# Patient Record
Sex: Female | Born: 1980 | Race: Black or African American | Hispanic: No | Marital: Married | Smoking: Never smoker
Health system: Southern US, Community
[De-identification: ages and names within clinical notes are randomized; demographics above are authoritative.]

## PROBLEM LIST (undated history)

## (undated) DIAGNOSIS — G5603 Carpal tunnel syndrome, bilateral upper limbs: Secondary | ICD-10-CM

## (undated) DIAGNOSIS — D649 Anemia, unspecified: Secondary | ICD-10-CM

## (undated) DIAGNOSIS — G43909 Migraine, unspecified, not intractable, without status migrainosus: Secondary | ICD-10-CM

## (undated) HISTORY — PX: TUBAL LIGATION: SHX77

## (undated) HISTORY — PX: CHOLECYSTECTOMY: SHX55

---

## 1999-06-07 ENCOUNTER — Emergency Department (HOSPITAL_COMMUNITY): Admission: EM | Admit: 1999-06-07 | Discharge: 1999-06-08 | Payer: Self-pay | Admitting: Emergency Medicine

## 1999-06-22 ENCOUNTER — Emergency Department (HOSPITAL_COMMUNITY): Admission: EM | Admit: 1999-06-22 | Discharge: 1999-06-22 | Payer: Self-pay | Admitting: Emergency Medicine

## 1999-06-22 ENCOUNTER — Encounter: Payer: Self-pay | Admitting: Emergency Medicine

## 2000-02-19 ENCOUNTER — Emergency Department (HOSPITAL_COMMUNITY): Admission: EM | Admit: 2000-02-19 | Discharge: 2000-02-19 | Payer: Self-pay

## 2002-04-21 ENCOUNTER — Other Ambulatory Visit: Admission: RE | Admit: 2002-04-21 | Discharge: 2002-04-21 | Payer: Self-pay | Admitting: Gynecology

## 2002-09-11 ENCOUNTER — Inpatient Hospital Stay (HOSPITAL_COMMUNITY): Admission: AD | Admit: 2002-09-11 | Discharge: 2002-09-11 | Payer: Self-pay | Admitting: *Deleted

## 2002-12-30 ENCOUNTER — Inpatient Hospital Stay (HOSPITAL_COMMUNITY): Admission: AD | Admit: 2002-12-30 | Discharge: 2002-12-30 | Payer: Self-pay | Admitting: *Deleted

## 2003-01-09 ENCOUNTER — Inpatient Hospital Stay (HOSPITAL_COMMUNITY): Admission: AD | Admit: 2003-01-09 | Discharge: 2003-01-12 | Payer: Self-pay | Admitting: Gynecology

## 2003-01-10 ENCOUNTER — Encounter (INDEPENDENT_AMBULATORY_CARE_PROVIDER_SITE_OTHER): Payer: Self-pay

## 2003-02-20 ENCOUNTER — Other Ambulatory Visit: Admission: RE | Admit: 2003-02-20 | Discharge: 2003-02-20 | Payer: Self-pay | Admitting: Gynecology

## 2004-05-02 ENCOUNTER — Other Ambulatory Visit: Admission: RE | Admit: 2004-05-02 | Discharge: 2004-05-02 | Payer: Self-pay | Admitting: Gynecology

## 2006-02-16 ENCOUNTER — Other Ambulatory Visit: Admission: RE | Admit: 2006-02-16 | Discharge: 2006-02-16 | Payer: Self-pay | Admitting: Gynecology

## 2006-04-29 ENCOUNTER — Inpatient Hospital Stay (HOSPITAL_COMMUNITY): Admission: AD | Admit: 2006-04-29 | Discharge: 2006-04-29 | Payer: Self-pay | Admitting: Gynecology

## 2006-06-08 ENCOUNTER — Encounter: Admission: RE | Admit: 2006-06-08 | Discharge: 2006-06-08 | Payer: Self-pay | Admitting: Women's Health

## 2006-08-31 ENCOUNTER — Ambulatory Visit: Payer: Self-pay | Admitting: Obstetrics & Gynecology

## 2006-08-31 ENCOUNTER — Ambulatory Visit (HOSPITAL_COMMUNITY): Admission: RE | Admit: 2006-08-31 | Discharge: 2006-08-31 | Payer: Self-pay | Admitting: Gynecology

## 2006-09-06 ENCOUNTER — Inpatient Hospital Stay (HOSPITAL_COMMUNITY): Admission: AD | Admit: 2006-09-06 | Discharge: 2006-09-09 | Payer: Self-pay | Admitting: Gynecology

## 2006-09-08 ENCOUNTER — Encounter (INDEPENDENT_AMBULATORY_CARE_PROVIDER_SITE_OTHER): Payer: Self-pay | Admitting: Specialist

## 2006-10-19 ENCOUNTER — Other Ambulatory Visit: Admission: RE | Admit: 2006-10-19 | Discharge: 2006-10-19 | Payer: Self-pay | Admitting: Gynecology

## 2007-07-07 IMAGING — US US OB LIMITED
1 series · 14 of 28 positions shown · non-contrast
Comparison: none

CLINICAL DATA: 20 weeks gestation with abdominal pain.  Known placenta previa.

[Series 1: us ob limited · 0.27mm/px · 14 of 30 slices shown]
[im 2/30]
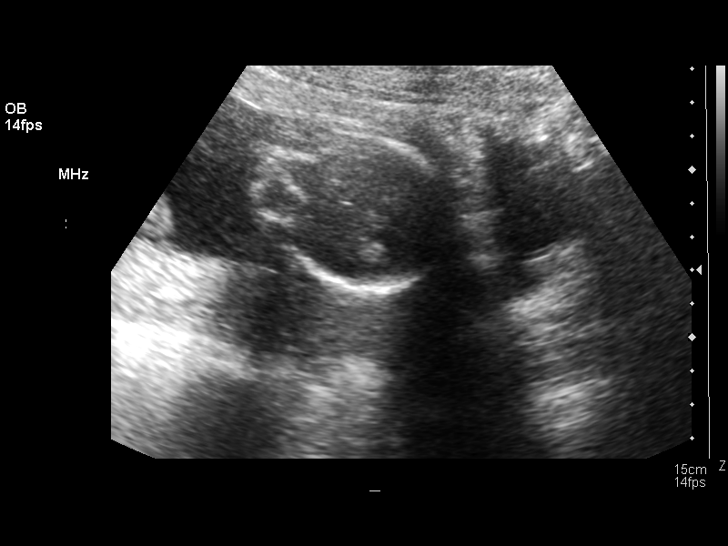
[im 4/30]
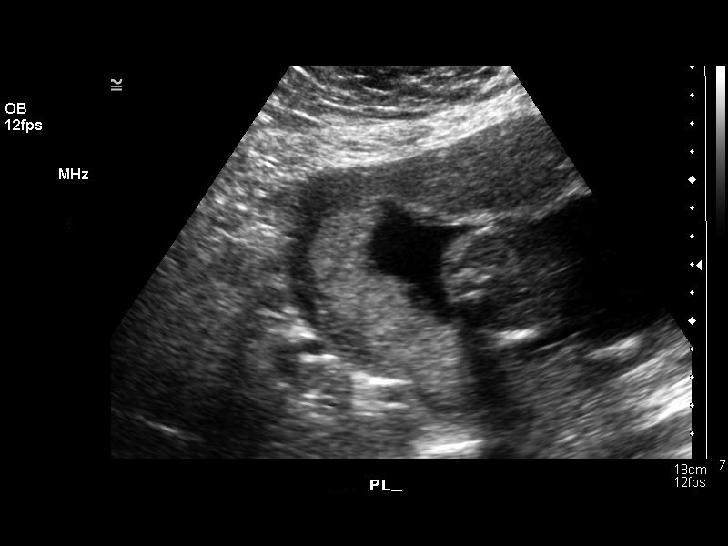
[im 6/30]
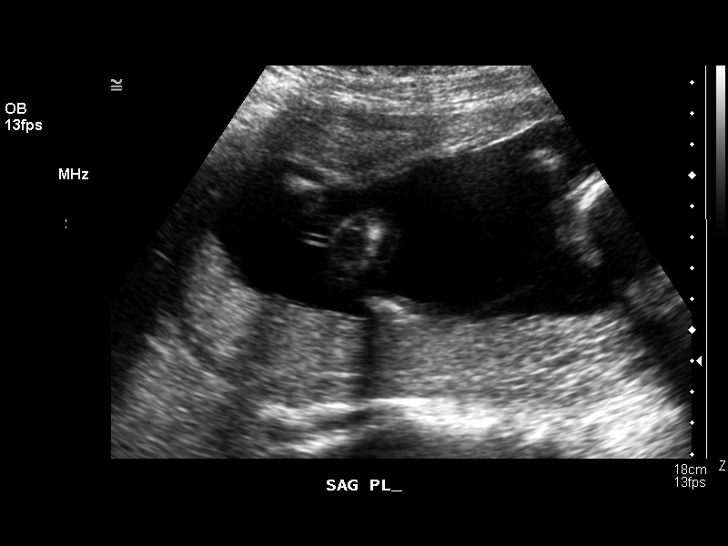
[im 8/30]
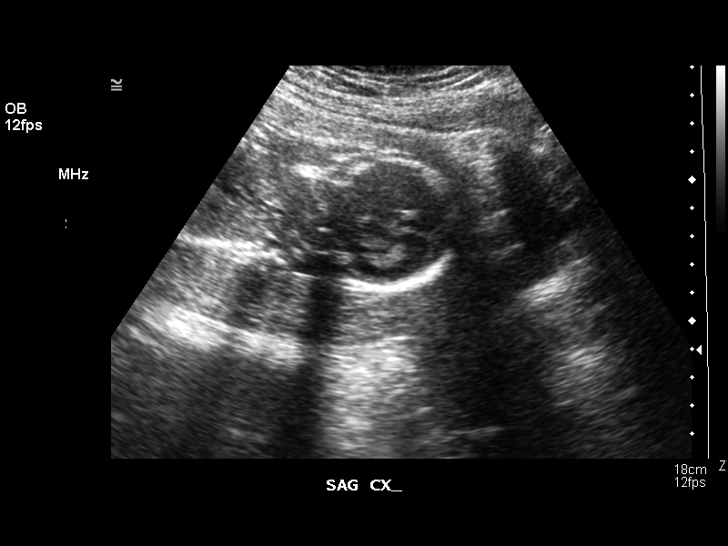
[im 10/30]
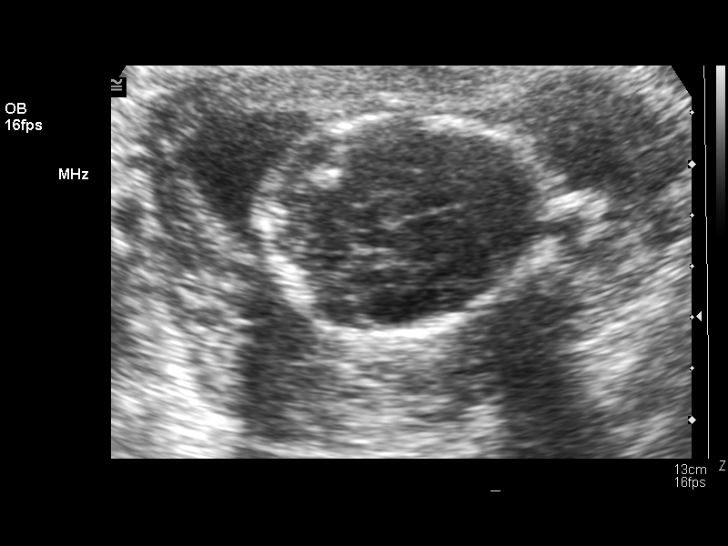
[im 12/30]
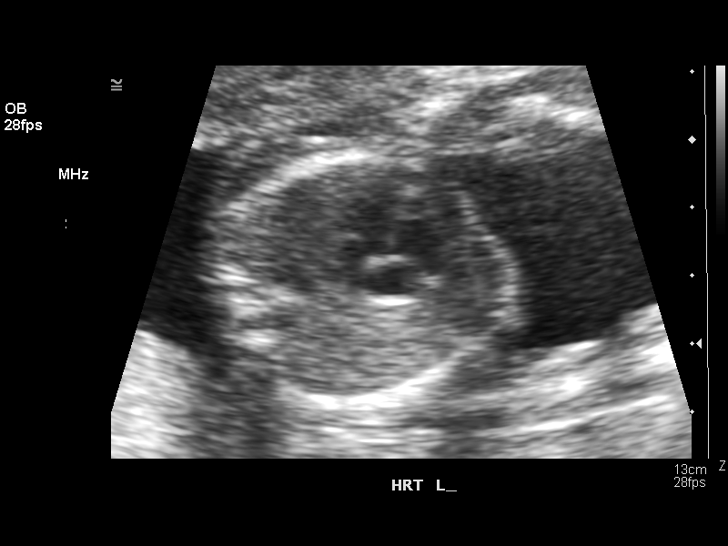
[im 14/30]
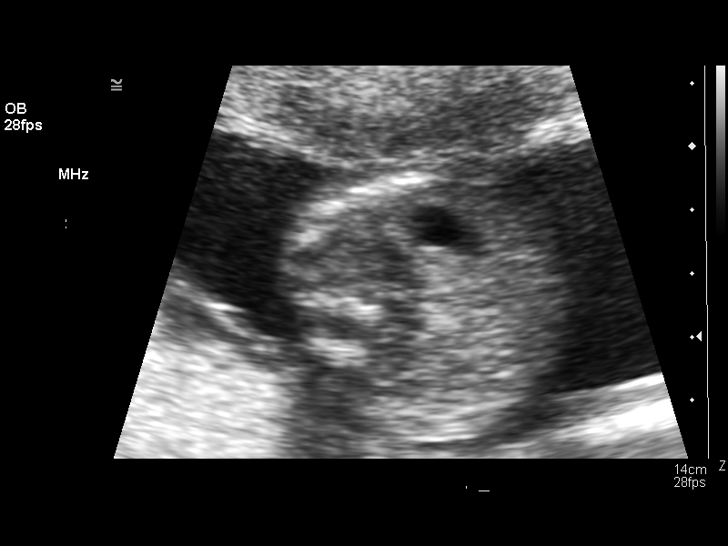
[im 17/30]
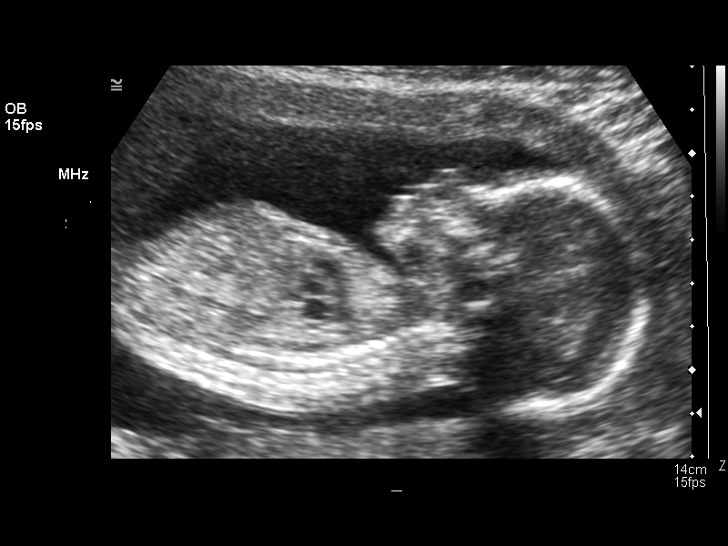
[im 19/30]
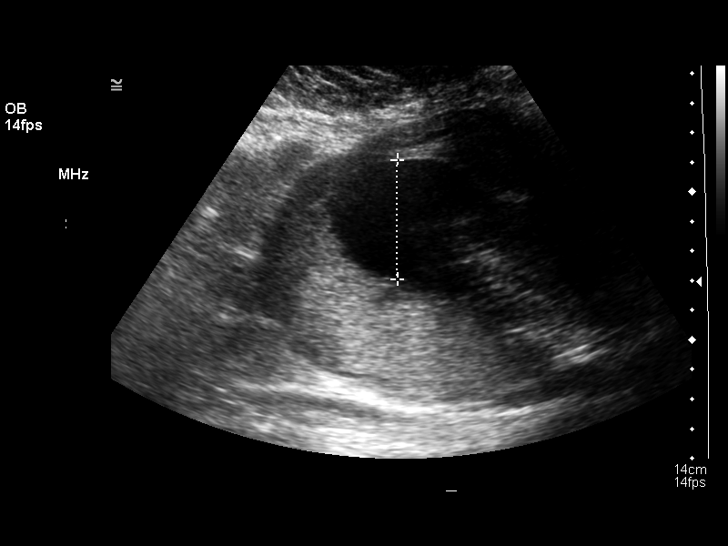
[im 21/30]
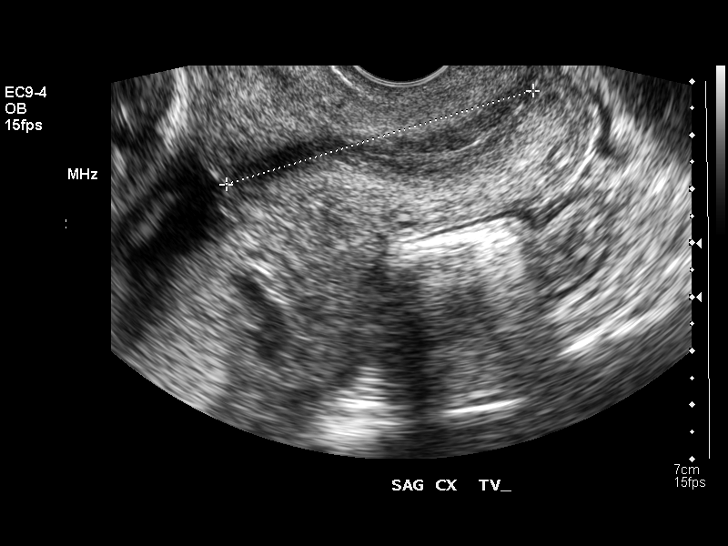
[im 23/30]
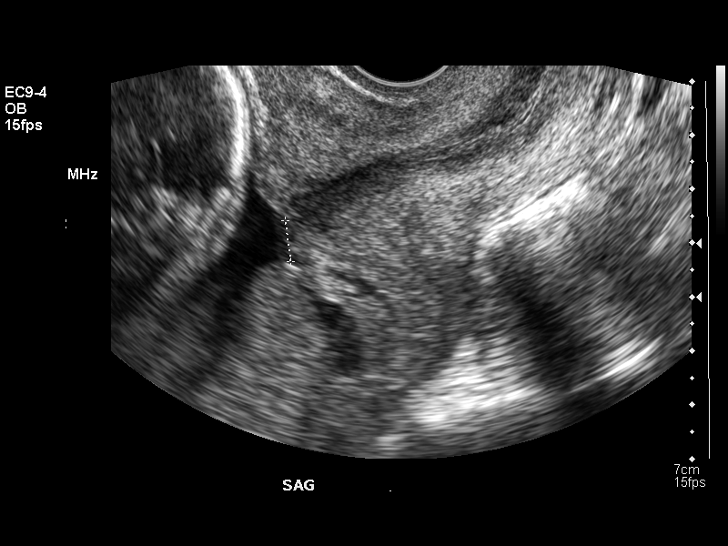
[im 25/30]
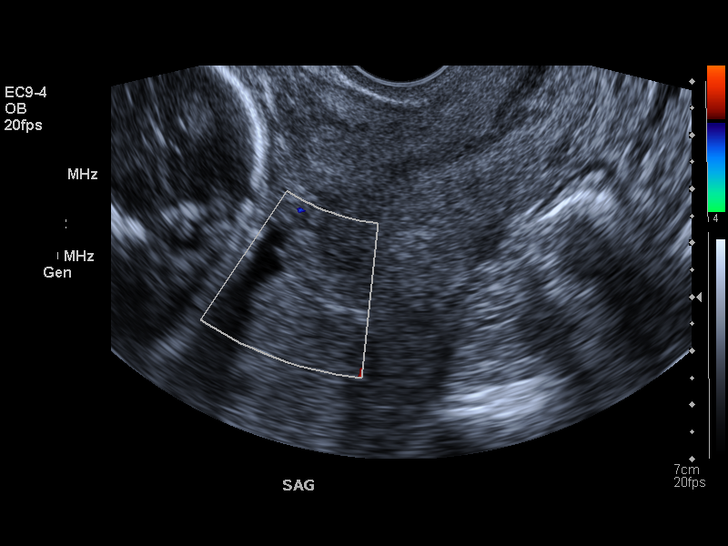
[im 27/30]
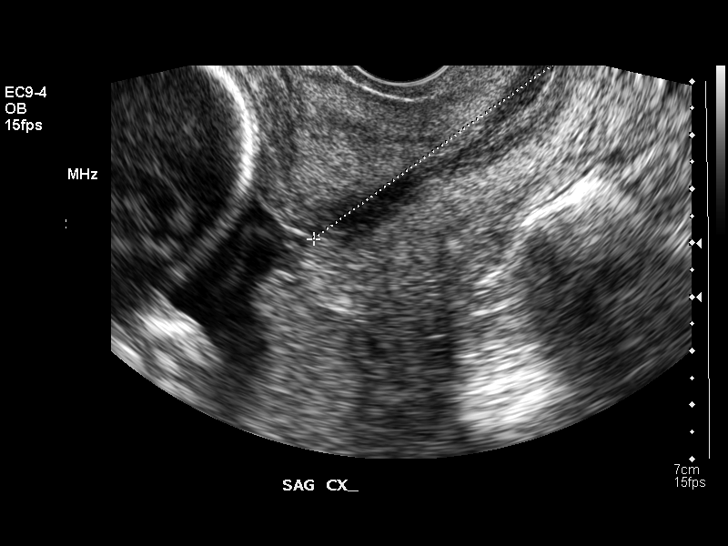
[im 30/30]
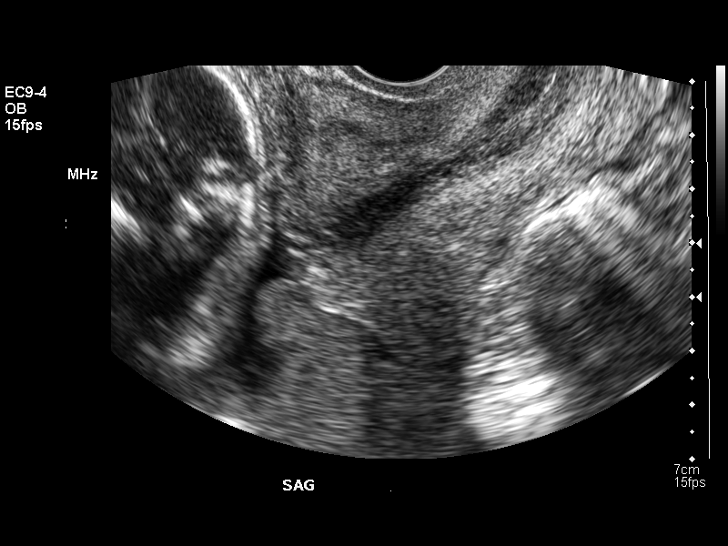

[14 of 28 positions shown; findings below may reference images not displayed]

LIMITED OBSTETRICAL ULTRASOUND AND TRANSVAGINAL OB US:
Number of Fetuses: 1
Heart Rate:  141
Movement:  Yes
Breathing:  No
Presentation:  Cephalic
Placental Location:  Posterior
Grade:  I
Previa:  Marginal; inferior marginal at internal os
Amniotic Fluid (Subjective):  Normal
Amniotic Fluid (Objective):  4.0 cm Vertical pocket 

Fetal measurements and complete anatomic evaluation were not requested.  The following fetal anatomy was visualized during this exam: Lateral ventricles, thalami, stomach, kidneys, and bladder.

MATERNAL UTERINE AND ADNEXAL FINDINGS
Cervix: 5.9 cm transvaginally
IMPRESSION: Single living intrauterine fetus in cephalic presentation with subjectively normal amniotic fluid volume.  Inferior margin of the posterior placenta is essentially at the internal cervical os.

## 2007-11-09 IMAGING — US US OB FOLLOW-UP
1 series · 13 of 28 positions shown · non-contrast
Comparison: none

CLINICAL DATA: Gestational diabetes with known marginal previa.  Assess growth and amniotic fluid volume.

[Series 1: us ob follow-up · 0.35mm/px · 13 of 30 slices shown]
[im 2/30]
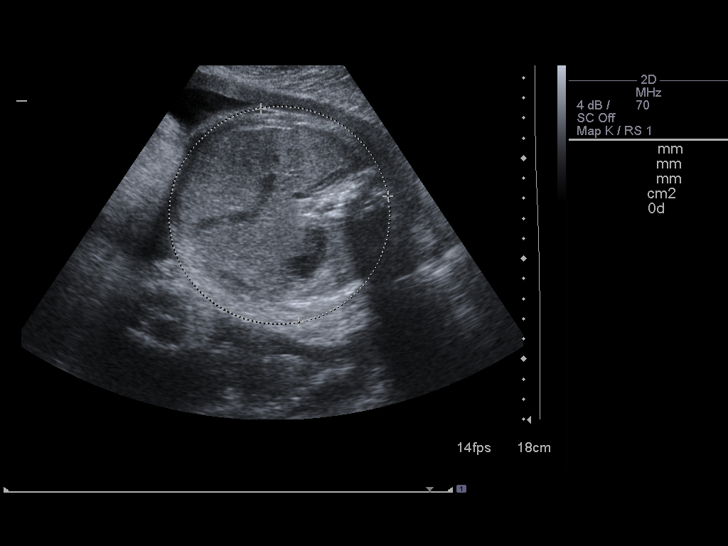
[im 4/30]
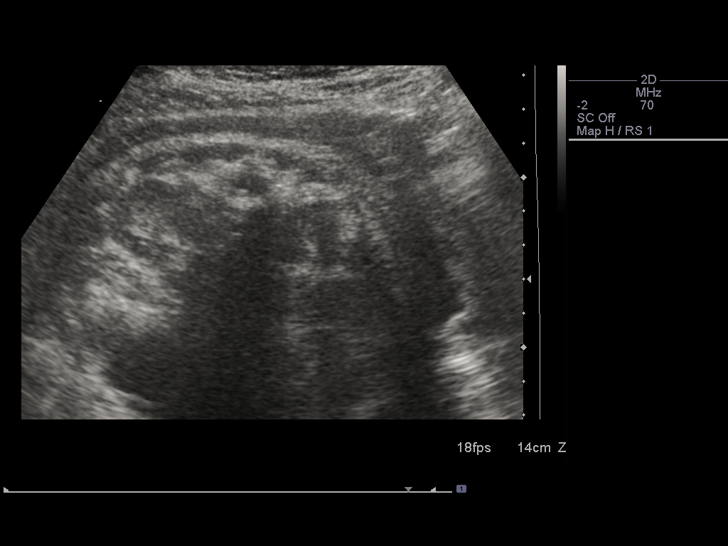
[im 6/30]
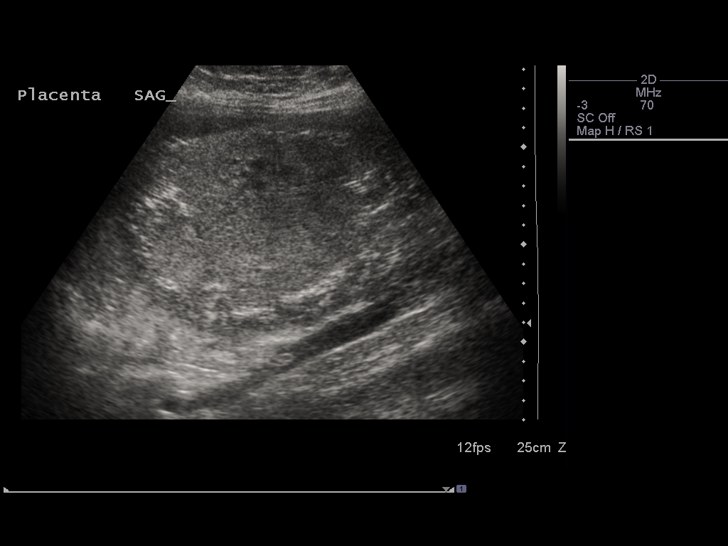
[im 8/30]
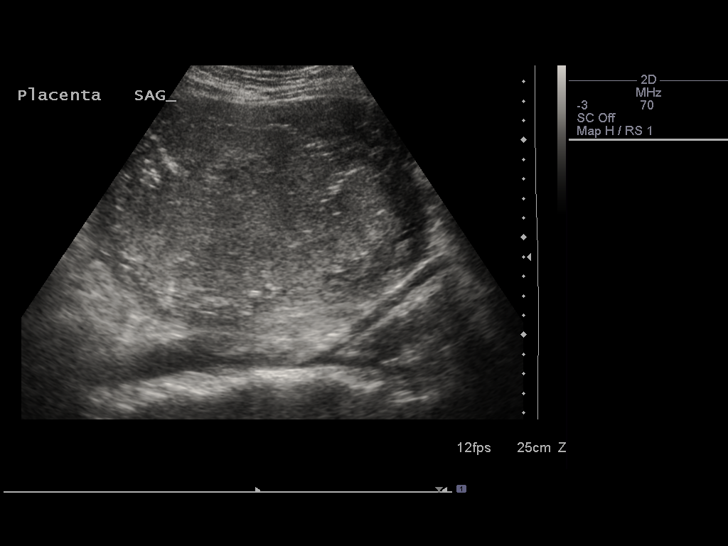
[im 10/30]
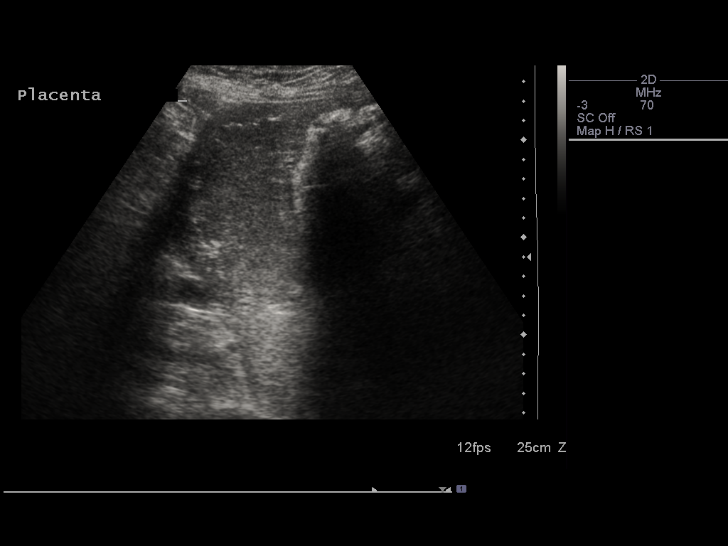
[im 12/30]
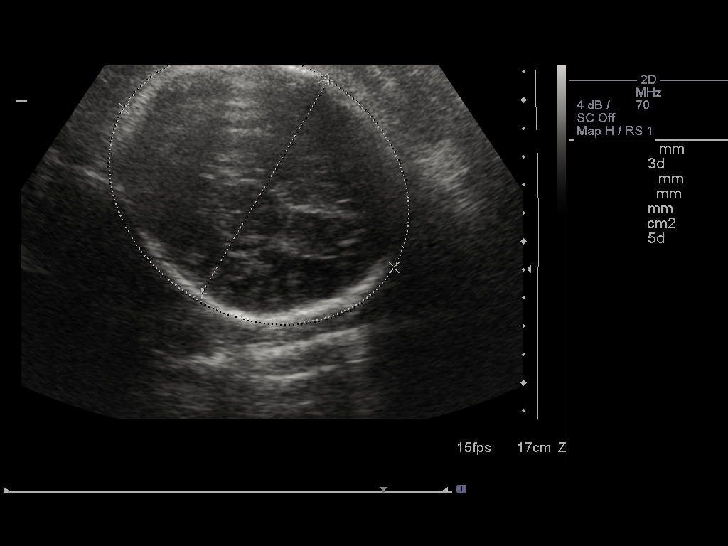
[im 16/30]
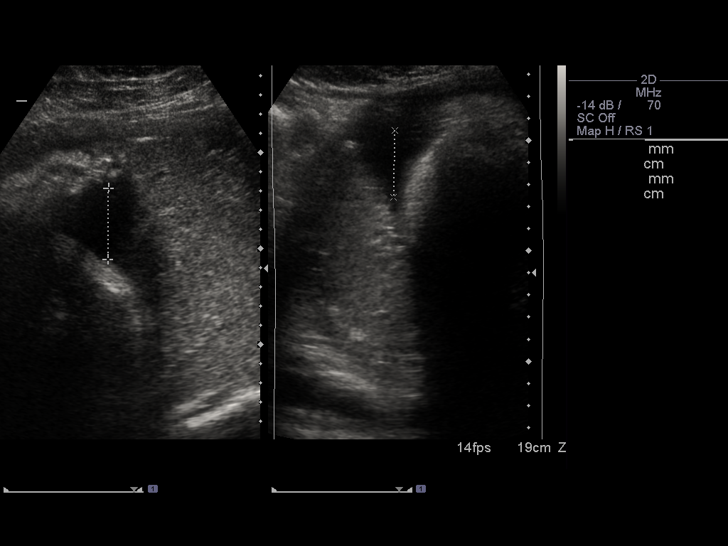
[im 18/30]
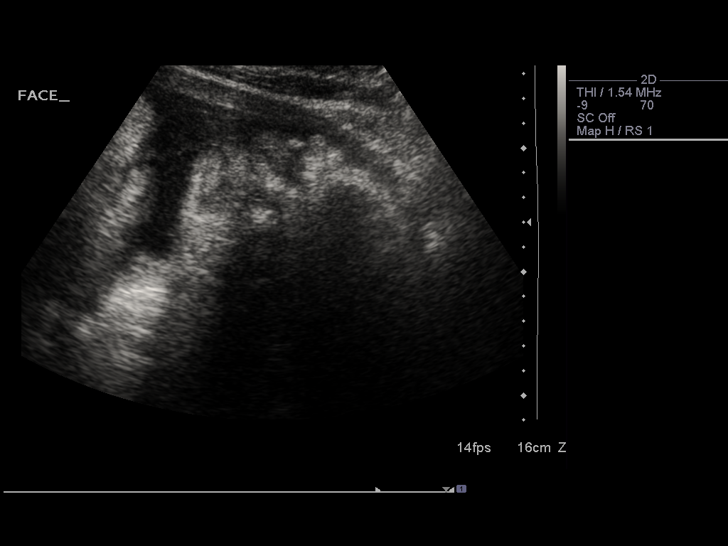
[im 20/30]
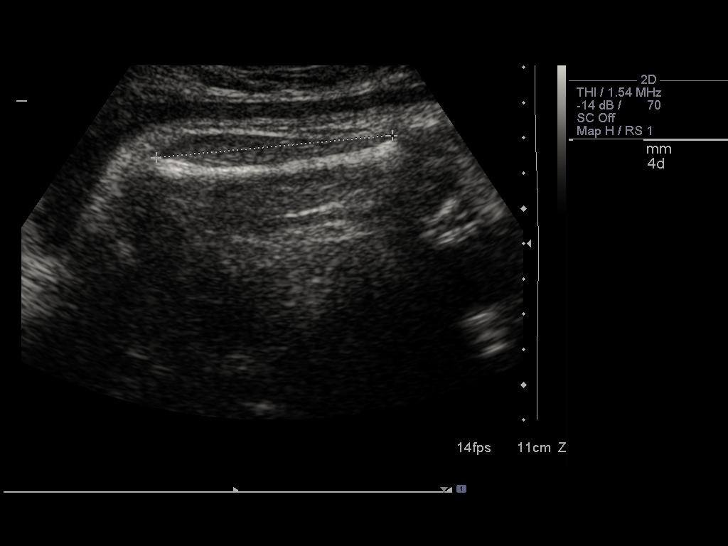
[im 22/30]
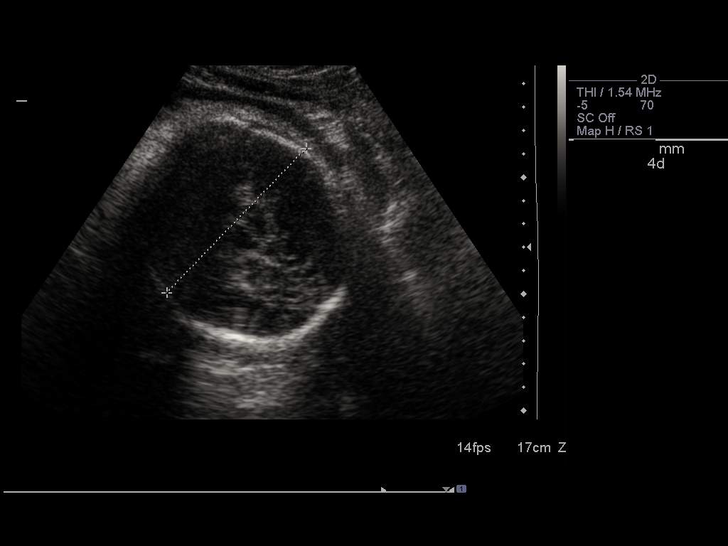
[im 24/30]
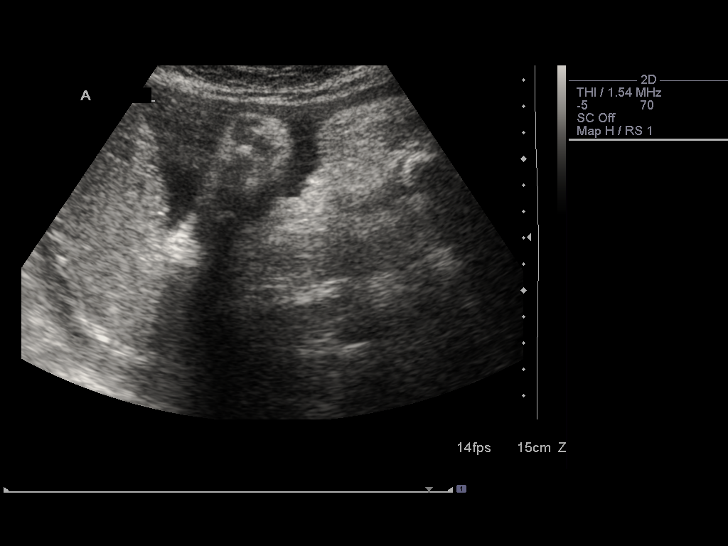
[im 26/30]
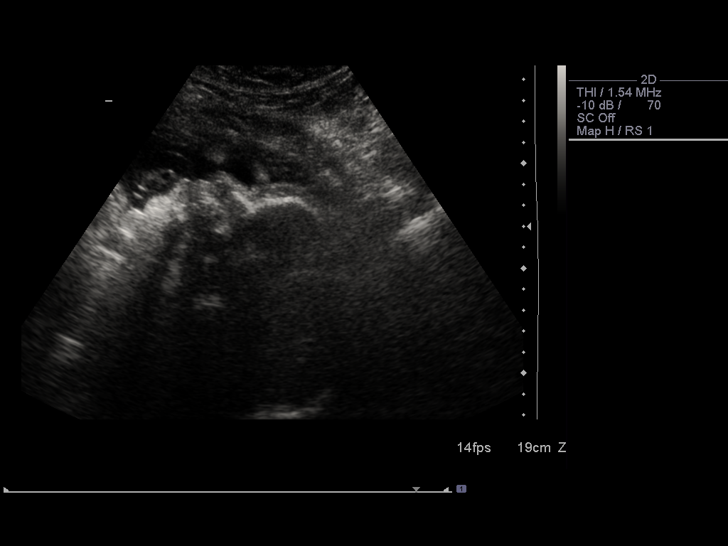
[im 28/30]
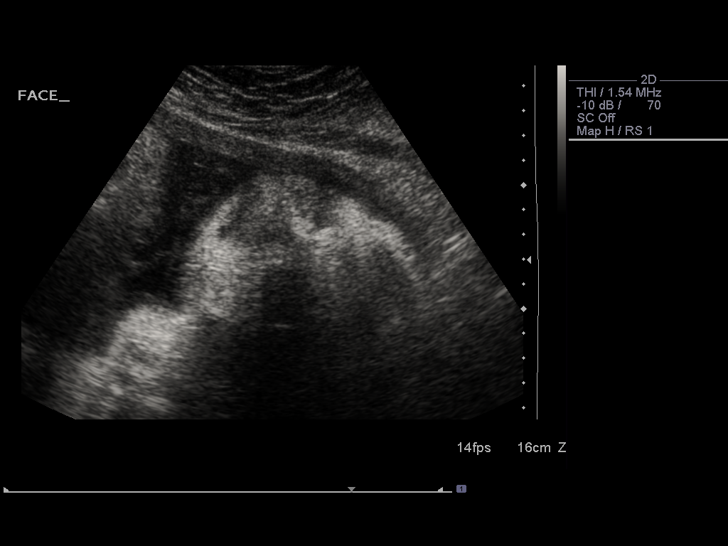

[13 of 28 positions shown; findings below may reference images not displayed]

OBSTETRICAL ULTRASOUND RE-EVALUATION:
 Number of Fetuses:  1
 Heart Rate:  125 bpm
 Movement:  Yes
 Breathing:  No
 Presentation:  Cephalic
 Placental Location:  Posterior
 Grade:  II
 Previa:  Marginal
 Amniotic Fluid (subjective):  Normal
 Amniotic Fluid (objective):  AFI 17.1 cm (7th-07th %ile = 7.3 to 23.9 cm for 38 weeks) 

 FETAL BIOMETRY
 BPD:  8.7 cm   35 w 0 d 
 HC:  31.4 cm   35 w 1 d 
 AC:  34.4 cm   38 w 2 d 
 FL:  6.6 cm   34 w 5 d 
 HL:  6.4 cm   37 w 1 d 
 BPD/OFD  0.82  (0.70 ? 0.86)
 FL/BPD  0.78  (0.71 ? 0.87)
 FL/AC  0.78  (0.20 ? 0.24)
 HC/AC  0.91  (0.92 ? 1.05)

 Mean GA:   35 w 6 d   US EDC:  09/30/06
 Assigned GA:  38 w 3 d   Assigned EDC:  [DATE]

 EFW:  5023 grams 25th ? 50th %ile (0777 ? 9292 g) for 38 weeks 

 FETAL ANATOMY
 Lateral Ventricles:  Previously visualized 
 Thalami/CSP:  Previously visualized 
 Posterior Fossa:  Not visualized 
 Nuchal Region:  Not visualized 
 Spine:  Not visualized 
 4 Chamber Heart on Left:  Not visualized 
 Stomach on Left:  Visualized 
 3 Vessel Cord:  Not visualized 
 Cord Insertion Site:  Not visualized 
 Kidneys:  Visualized 
 Bladder:  Visualized 
 Extremities:  Not visualized 

 ADDITIONAL ANATOMY VISUALIZED:  Diaphragm.  

 MATERNAL UTERINE AND ADNEXAL FINDINGS
 Cervix:  Not evaluated; >34 weeks.
IMPRESSION: 1.  Single intrauterine pregnancy demonstrating an estimated gestational age by ultrasound of 35 weeks 6 days.  Fetal parameters are scattered with head measurements correlating with 35 week 1 day gestation, femur length with a 34 week 5 day gestation, and abdominal circumference and humeral length correlating more with expected assigned gestational age of 38 weeks 3 days.  The femur length is just above the 5th percentile for a 38 week gestation.  Given the maternal and paternal stature this likely represents physiologic limb shortening in light of the more normal humeral length.  Currently the estimated fetal weight is just below the 50th percentile for a 38 week gestation.  The fetal weight may be spuriously underestimated due to the relatively small head size.  
 2.  Subjectively and quantitatively normal amniotic fluid volume.  
 3.  No late developing fetal anatomic abnormalities are seen associated with the stomach, kidneys, or bladder.  The lateral ventricles and 4-chamber heart view could not be seen with clarity due to positioning on today?s exam.

## 2008-10-01 ENCOUNTER — Emergency Department (HOSPITAL_BASED_OUTPATIENT_CLINIC_OR_DEPARTMENT_OTHER): Admission: EM | Admit: 2008-10-01 | Discharge: 2008-10-01 | Payer: Self-pay | Admitting: Emergency Medicine

## 2011-02-28 NOTE — Discharge Summary (Signed)
   NAME:  [name], [name]                     ACCOUNT NO.:  [id number]   MEDICAL RECORD NO.:  [id number]                   PATIENT TYPE:  INP   LOCATION:  9111                                 [facility]ACILITY:  [facility]   PHYSICIAN:  Juan H. Lily Peer, M.D.             DATE OF BIRTH:  [birth year]   DATE OF ADMISSION:  01/09/2003  DATE OF DISCHARGE:  01/12/2003                                 DISCHARGE SUMMARY   DISCHARGE DIAGNOSIS:  1. Intrauterine pregnancy with delivery status post induction for suspect     IUGR.  2. Positive Group-B strep.  3. Status post spontaneous vaginal delivery.   HISTORY:  This 30 year old female gravida 1, para 0 with an EDC of 01/11/03.  Prenatal course uncomplicated.  The patient has a history of asthma, also  positive Group-B strep.  She presents to the office on 01/09/03 complaining  of abdominal pain over the weekend.  Placed on a monitor, had very mild  infrequent contractions, reactive on a stress test.  She was sent for an  ultrasound to rule out possibility of underlying abruption __________ back  discomfort.  An ultrasound demonstrated that the fetus tested severe IUGR  recorded to be the fourth 8th percentile for 40 weeks.  The head  circumference was equivalent to 35 weeks, the abdominal circumference  measured 36 weeks.  The abdominal report stated suboptimal images secondary  to the patient's body habitus.  She weighs 239 pounds.  Estimated fetal  weight is approximately 6.2 pounds.  Patient was admitted for induction for  same.   HOSPITAL COURSE:  On 01/09/03 the patient was admitted and was begun on IV  penicillin G per Group-B strep protocol and Cervidil.  Begun on Pitocin, was  given Cervidil.  The Cervidil was administered on 01/10/03.  Subsequently on  01/10/03 at 1300 the patient underwent a spontaneous vaginal delivery of a  female, Apgars of 8 and 9, weight 6 pounds 6 ounces.  There were no  lacerations.  No episiotomy.  Postpartum patient  afebrile, remained in  stable condition.  She was discharged to home on 01/12/03 and given  [facility] postpartum instructions and postpartum booklet.  Patient is A positive, rubella immune on 01/11/03.  Hemoglobin is 12.1.   DISPOSITION:  1. Patient is discharged to home.  2. Return to the office in six weeks; if she has any problems prior to that     time to be seen in the office.       Susa Loffler, P.A.                    Juan H. Lily Peer, M.D.    TSG/MEDQ  D:  02/04/2003  T:  02/04/2003  Job:  [id number]

## 2011-02-28 NOTE — Op Note (Signed)
NAME:  [name], [name]           ACCOUNT NO.:  [id number]   MEDICAL RECORD NO.:  [id number]          PATIENT TYPE:  INP   LOCATION:  9125                          [facility]ILITY:  [facility]   PHYSICIAN:  Timothy P. Fontaine, M.D.DATE OF BIRTH:  06-21-1981   DATE OF PROCEDURE:  09/08/2006  DATE OF DISCHARGE:                                 OPERATIVE REPORT   PREOPERATIVE DIAGNOSES:  Postpartum, desires permanent sterilization.   POSTOPERATIVE DIAGNOSES:  Postpartum, desires permanent sterilization.   PROCEDURE:  Modified [place]oy bilateral tubal sterilization.   SURGEON:  Dr. Audie Box.   ANESTHETIC:  Epidural.   ESTIMATED BLOOD LOSS:  Minimal.   COMPLICATIONS:  None.   SPECIMEN:  Portions of right and portions of left fallopian tube.   FINDINGS:  Right and left fallopian tubes normal length, caliber, fimbriated  ends.  Right and left ovaries grossly normal to limited inspection.   PROCEDURE:  The patient was taken to the operating room, underwent dosing of  her epidural catheter having been previously placed on labor and delivery.  The patient received a periumbilical preparation with Betadine solution and  draped in usual fashion.  After assuring adequate anesthesia the abdomen  sharply entered through a repeat infraumbilical transverse incision,  excising the old scar upon entry without difficulty.  The left fallopian  tube was then identified, delivered through the incision, traced from its  insertion to its fimbriated end, and mid tubal segment was then doubly  ligated using 0 plain suture in the intervening segment sharply excised.  Tubal lumen as well as adequate hemostasis was grossly visualized.  A  similar procedure was carried out on the other side.  The anterior fascia  was then reapproximated using 0 Vicryl suture in a running stitch.  Subcutaneous tissues showed adequate hemostasis and were reapproximated  using 4-0 Vicryl interrupted stitch.  The skin was reapproximated  using  Dermabond skin adhesive.  The patient was then taken to recovery room in  good condition having tolerated procedure well.      Timothy P. Fontaine, M.D.  Electronically Signed     TPF/MEDQ  D:  09/08/2006  T:  09/08/2006  Job:  [id number]

## 2011-02-28 NOTE — H&P (Signed)
NAME:  [name], [name]                     ACCOUNT NO.:  [id number]   MEDICAL RECORD NO.:  [id number]                   PATIENT TYPE:  INP   LOCATION:  9163                                 [facility]ILITY[facility]   PHYSICIAN:  Juan H. Lily Peer, M.D.             DATE OF BIRTH:  11-13-1980   DATE OF ADMISSION:  01/09/2003  DATE OF DISCHARGE:                                HISTORY & PHYSICAL   CHIEF COMPLAINT:  Low abdominal pain.   HISTORY OF PRESENT ILLNESS:  The patient is a 30 year old, gravida 1, para  0, with a last menstrual period of April 04, 2002, estimated date of  confinement of January 11, 2003, currently 39-6/[redacted] weeks gestation.  She  presented to the office today complaining of low back and lower abdominal  pain over the weekend.  The patient states that she had positive fetal  movement.  She was placed on the monitor.  She was having very mild  infrequent contractions.  Reactive nonstress test in the 150-170 beats per  minute range.  She was sent for an ultrasound to rule out the possibility of  underlying abruption contributing to her back discomfort, although she does  not appear to be in any acute distress.  The ultrasound interestingly  demonstrated the fetus to have severe IUGR, recorded to be in the 4th-8th  percentile for 40-week gestation.  The head circumference was equivalent to  35 weeks as was the abdominal circumference and femur length measurement at  36 weeks.  All of these were off by four weeks, but the ultrasonic report  stated suboptimal images secondary to the patient's body habitus.  She  weighs 239 pounds.  The estimated fetal weight is about 6.2 pounds.  The  patient has gained a total of 19 pounds throughout her pregnancy.  Her  prenatal course in that she has a history of asthma for which she takes  albuterol and Flonase p.r.n.  She has a history of positive group B  Streptococcus.  She had positive strep throat during the pregnancy for which  she was  treated appropriately.  Due to her being [place], she had a  hemoglobin electrophoresis which revealed no evidence of sickle cell trait.  She was treated for a urinary tract infection with Macrobid and up until the  present time when she was noted to have the evidence of IUGR.  On fundal  height measures, she measured 36-37 cm.   PAST MEDICAL HISTORY:  As described above.  History of asthma.  She has a  history of in the past before pregnancy several years ago of GC and  chlamydia.  She denies any other medical problems with the exception of  cholecystectomy in the year 2003.   ALLERGIES:  She denies any allergies.   REVIEW OF SYSTEMS:  See [place] form.   PHYSICAL EXAMINATION:  VITAL SIGNS:  The patient's blood pressure today was  110/68.  Urine  with trace protein and negative glucose.  WEIGHT:  239 pounds.  HEENT:  Unremarkable.  NECK:  Supple.  Trachea midline.  No carotid bruits.  No thyromegaly.  LUNGS:  Clear to auscultation without rhonchi or wheezes.  HEART:  Regular rate and rhythm.  No murmurs or gallops.  BREASTS:  Exam not done.  ABDOMEN:  Gravid uterus.  Vertex presentation by [facility] maneuver.  Fundal  height 36-37 cm.  PELVIC:  Cervix long, closed, and posterior.  EXTREMITIES:  DTRs 1+.  Negative clonus.  Trace edema.   PRENATAL LABORATORY DATA:  B+ blood type.  Negative antibiotic screen.  VDRL  was nonreactive.  Rubella immune.  Hepatitis B surface antigen and HIV were  negative.  Alpha-fetoprotein was normal.  GBS culture was positive.  Diabetes screen was normal.   ASSESSMENT:  A 30 year old, gravida 1, para 0, at 39-6/[redacted] weeks gestation  with evidence of severe intrauterine growth restriction in the 4th-8th  percentile on the growth curve.  Estimated fetal weight 2780-2833 g.  AFI  11.1 cm, in the 40th percentile.  Reactive fetal heart rate tracing.  Patient with positive GBS culture.  Of note, she had a urinalysis done today  due to the fact that  she was having some pelvic pressure and the urinalysis  also demonstrated evidence of possible early urinary tract infection with 2+  bacteria and 6-8 wbc's.   PLAN:  Will admit her and start IV fluids of lactated Ringer's.  Will place  Cervidil for cervical ripening.  Will start on Pen-G 5 million units IV as a  loading dose followed by 2.5 million units IV q.4h.  After 12 hours of the  Cervidil, will start the Pitocin providing the fetal heart rate tracing  continues to be reassuring.  All of this and above were discussed with the  patient and concerns were raised.  All questions were answered.  Will follow  accordingly.                                               Juan H. Lily Peer, M.D.    JHF/MEDQ  D:  01/09/2003  T:  01/09/2003  Job:  [id number]

## 2011-07-18 LAB — RAPID STREP SCREEN (MED CTR MEBANE ONLY): Streptococcus, Group A Screen (Direct): POSITIVE — AB

## 2012-06-20 ENCOUNTER — Emergency Department (HOSPITAL_COMMUNITY)
Admission: EM | Admit: 2012-06-20 | Discharge: 2012-06-20 | Disposition: A | Discharge status: Home / Self Care | Payer: Medicare HMO | Source: Home / Self Care

## 2012-10-13 HISTORY — PX: OTHER SURGICAL HISTORY: SHX169

## 2018-08-14 ENCOUNTER — Emergency Department (HOSPITAL_BASED_OUTPATIENT_CLINIC_OR_DEPARTMENT_OTHER): Payer: Medicare HMO

## 2018-08-14 ENCOUNTER — Other Ambulatory Visit: Payer: Self-pay

## 2018-08-14 ENCOUNTER — Emergency Department (HOSPITAL_BASED_OUTPATIENT_CLINIC_OR_DEPARTMENT_OTHER)
Admission: EM | Admit: 2018-08-14 | Discharge: 2018-08-14 | Disposition: A | Discharge status: Home / Self Care | Payer: Medicare HMO | Attending: Emergency Medicine | Admitting: Emergency Medicine

## 2018-08-14 ENCOUNTER — Encounter (HOSPITAL_BASED_OUTPATIENT_CLINIC_OR_DEPARTMENT_OTHER): Payer: Self-pay | Admitting: Emergency Medicine

## 2018-08-14 DIAGNOSIS — M940 Chondrocostal junction syndrome [Tietze]: Secondary | ICD-10-CM | POA: Insufficient documentation

## 2018-08-14 DIAGNOSIS — J069 Acute upper respiratory infection, unspecified: Secondary | ICD-10-CM | POA: Insufficient documentation

## 2018-08-14 DIAGNOSIS — B9789 Other viral agents as the cause of diseases classified elsewhere: Secondary | ICD-10-CM

## 2018-08-14 HISTORY — DX: Migraine, unspecified, not intractable, without status migrainosus: G43.909

## 2018-08-14 MED ORDER — BENZONATATE 100 MG PO CAPS: 100.0000 mg | ORAL_CAPSULE | Freq: Three times a day (TID) | ORAL | 0 refills | Status: DC

## 2018-08-14 MED ORDER — PREDNISONE 20 MG PO TABS: 40.0000 mg | ORAL_TABLET | Freq: Every day | ORAL | 0 refills | Status: AC

## 2018-08-14 NOTE — ED Provider Notes (Signed)
[facility] EMERGENCY DEPARTMENT Provider Note   CSN: [id number] Arrival date & time: 08/14/18  1054     History   Chief Complaint Chief Complaint  Patient presents with  . Cough    HPI [name] is a 37 y.o. female resents for evaluation of cough and nasal congestion that has been ongoing for last 5 days as well as chest soreness that developed this morning.  Patient states that for the last several days, she has been sick with nasal congestion, rhinorrhea, cough.  She states that she took over-the-counter Robitussin, other cough medications which slightly improved her symptoms.  She states cough worsened last night.  Cough is productive of yellow phlegm.  No hemoptysis.  She states that this morning, she started developing some chest soreness.  She states that the chest soreness is intermittent and is worse with palpation, coughing, and deep inspriation.  No chest pain with exertion.  She did not get nauseous or diaphoretic with onset of this pain.  Patient has not taken any medications for the pain.  Patient states that she initially also had a sore throat but that has improved.  Patient denies any fevers, difficulty breathing, abdominal pain, vomiting.  The history is provided by the patient.    Past Medical History:  Diagnosis Date  . Migraines     There are no active problems to display for this patient.   Past Surgical History:  Procedure Laterality Date  . TUBAL LIGATION       OB History   None      Home Medications    Prior to Admission medications   Medication Sig Start Date End Date Taking? Authorizing Provider  benzonatate (TESSALON) 100 MG capsule Take 1 capsule (100 mg total) by mouth every 8 (eight) hours. 08/14/18   Volanda Napoleon, PA-C  predniSONE (DELTASONE) 20 MG tablet Take 2 tablets (40 mg total) by mouth daily for 4 days. 08/14/18 08/18/18  Volanda Napoleon, PA-C    Family History No family history on file.  Social  History Social History   Tobacco Use  . Smoking status: Never Smoker  . Smokeless tobacco: Never Used  Substance Use Topics  . Alcohol use: Not Currently  . Drug use: Never     Allergies   Patient has no known allergies.   Review of Systems Review of Systems  Constitutional: Negative for fever.  HENT: Positive for congestion.   Respiratory: Positive for cough. Negative for shortness of breath.   Cardiovascular: Positive for chest pain (soreness).  Gastrointestinal: Negative for abdominal pain, nausea and vomiting.  Genitourinary: Negative for dysuria and hematuria.  Neurological: Negative for headaches.  All other systems reviewed and are negative.    Physical Exam Updated Vital Signs BP (!) 145/99 (BP Location: Left Arm)   Pulse 84   Temp 98.5 F (36.9 C) (Oral)   Resp 18   Ht 5\' 4"  (1.626 m)   Wt 108.9 kg   LMP 07/27/2018   SpO2 99%   BMI 41.20 kg/m   Physical Exam  Constitutional: She is oriented to person, place, and time. She appears well-developed and well-nourished.  HENT:  Head: Normocephalic and atraumatic.  Nose: Mucosal edema present.  Mouth/Throat: Oropharynx is clear and moist and mucous membranes are normal.  Edematous and erythematous bilateral nasal turbinates.  Airways patent, phonation is intact.  Eyes: Pupils are equal, round, and reactive to light. Conjunctivae, EOM and lids are normal.  Neck: Full passive range of motion  without pain.  Cardiovascular: Normal rate, regular rhythm, normal heart sounds and normal pulses. Exam reveals no gallop and no friction rub.  No murmur heard. Pulses:      Radial pulses are 2+ on the right side, and 2+ on the left side.  Pulmonary/Chest: Effort normal and breath sounds normal.  Lungs clear to auscultation bilaterally.  Symmetric chest rise.  No wheezing, rales, rhonchi.  Pain reproduced with palpation and movement of the upper extremities.  Abdominal: Soft. Normal appearance. There is no tenderness.  There is no rigidity and no guarding.  Musculoskeletal: Normal range of motion.  Neurological: She is alert and oriented to person, place, and time.  Skin: Skin is warm and dry. Capillary refill takes less than 2 seconds.  Psychiatric: She has a normal mood and affect. Her speech is normal.  Nursing note and vitals reviewed.    ED Treatments / Results  Labs (all labs ordered are listed, but only abnormal results are displayed) Labs Reviewed - No data to display  EKG None  Radiology Dg Chest 2 View  Result Date: 08/14/2018 CLINICAL DATA:  Cough and shortness of breath for the past week. EXAM: CHEST - 2 VIEW COMPARISON:  None. FINDINGS: The heart size and mediastinal contours are within normal limits. Both lungs are clear. The visualized skeletal structures are unremarkable. IMPRESSION: No active cardiopulmonary disease. Electronically Signed   By: Titus Dubin M.D.   On: 08/14/2018 11:50    Procedures Procedures (including critical care time)  Medications Ordered in ED Medications - No data to display   Initial Impression / Assessment and Plan / ED Course  I have reviewed the triage vital signs and the nursing notes.  Pertinent labs & imaging results that were available during my care of the patient were reviewed by me and considered in my medical decision making (see chart for details).     37 year old female who presents for evaluation of cough, nasal congestion times several days as well as chest soreness that began this morning.  Reports cough worsened last night.  The soreness is intermittent and is worse with coughing, palpation and is worse with deep inspiration.  No exertional chest pain.  No associated diaphoresis, nausea. Patient is afebrile, non-toxic appearing, sitting comfortably on examination table. Vital signs reviewed and stable.  Pain reproduced by palpation on exam.  Consider upper respiratory infection with associated costochondritis.  History/physical exam  is not concerning for ACS etiology.  Additionally, not suspect PE given history/physical exam. Plan for CXR for eval pneumonia.   Chest x-ray reviewed.  Negative for any acute infectious etiology.   Discussed results with patient. At this time, patient exhibits no emergent life-threatening condition that require further evaluation in ED.  Will plan for short course of prednisone to help with cough.  Encourage at home supportive care measures. Patient had ample opportunity for questions and discussion. All patient's questions were answered with full understanding. Strict return precautions discussed. Patient expresses understanding and agreement to plan.   Final Clinical Impressions(s) / ED Diagnoses   Final diagnoses:  Viral URI with cough  Costochondritis    ED Discharge Orders         Ordered    benzonatate (TESSALON) 100 MG capsule  Every 8 hours     08/14/18 1201    predniSONE (DELTASONE) 20 MG tablet  Daily     08/14/18 1201           Volanda Napoleon, PA-C 08/14/18 2000  Lajean Saver, MD 08/15/18 [phone]

## 2018-08-14 NOTE — ED Triage Notes (Signed)
Cough and congestion x 4 days. 

## 2018-08-14 NOTE — Discharge Instructions (Addendum)
You can take Tylenol or Ibuprofen as directed for pain. You can alternate Tylenol and Ibuprofen every 4 hours. If you take Tylenol at 1pm, then you can take Ibuprofen at 5pm. Then you can take Tylenol again at 9pm.   Take Prednisone as directed.   Take Tessalon perles to help with cough.   Follow-up with [facility] for further evaluation.   Return to the Emergency Department for any difficulty breathing, abdominal pain, chest pain, fever, persistent vomiting.

## 2019-10-22 IMAGING — DX DG CHEST 2V
2 series · 2 of 2 positions shown · non-contrast
Comparison: None.

CLINICAL DATA: Cough and shortness of breath for the past week.

EXAM:
CHEST - 2 VIEW

[chest pa]
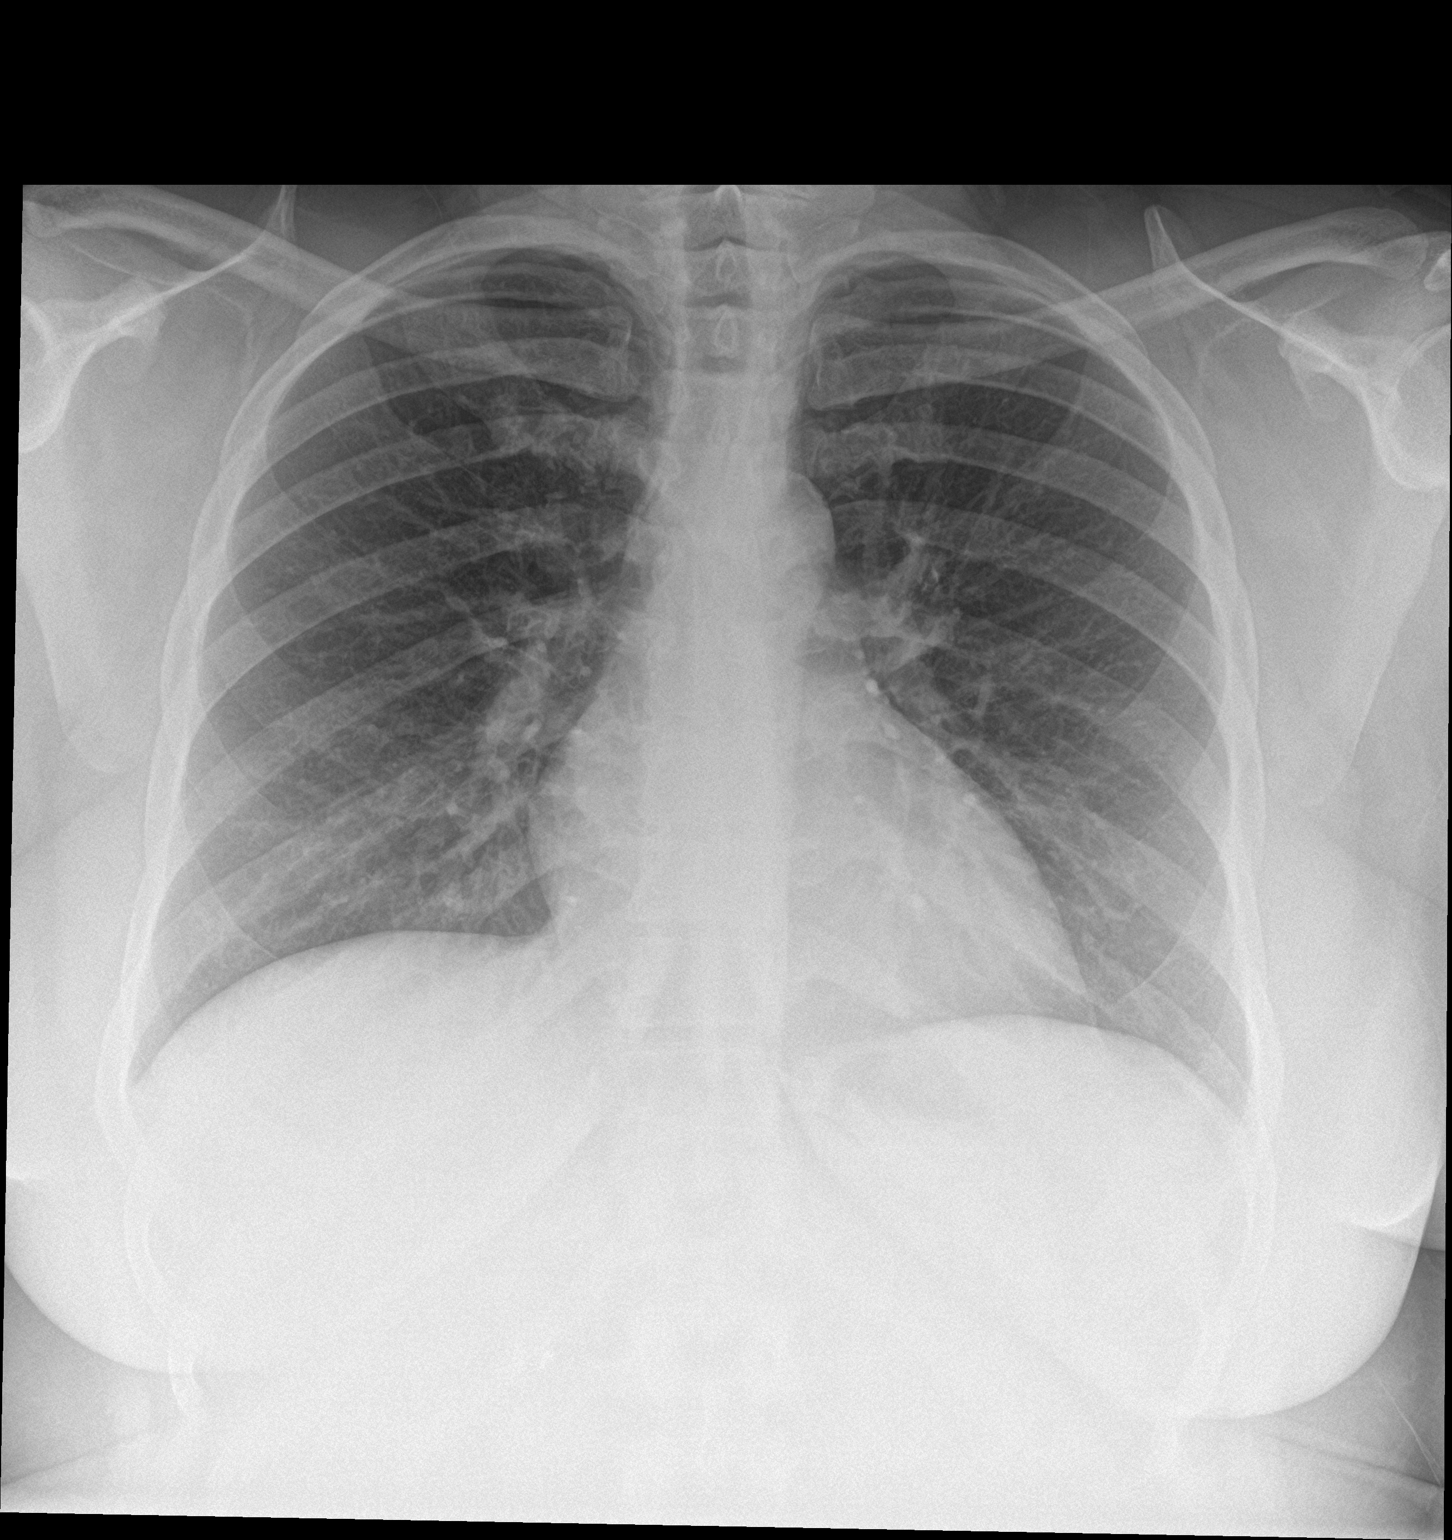

[chest lat]
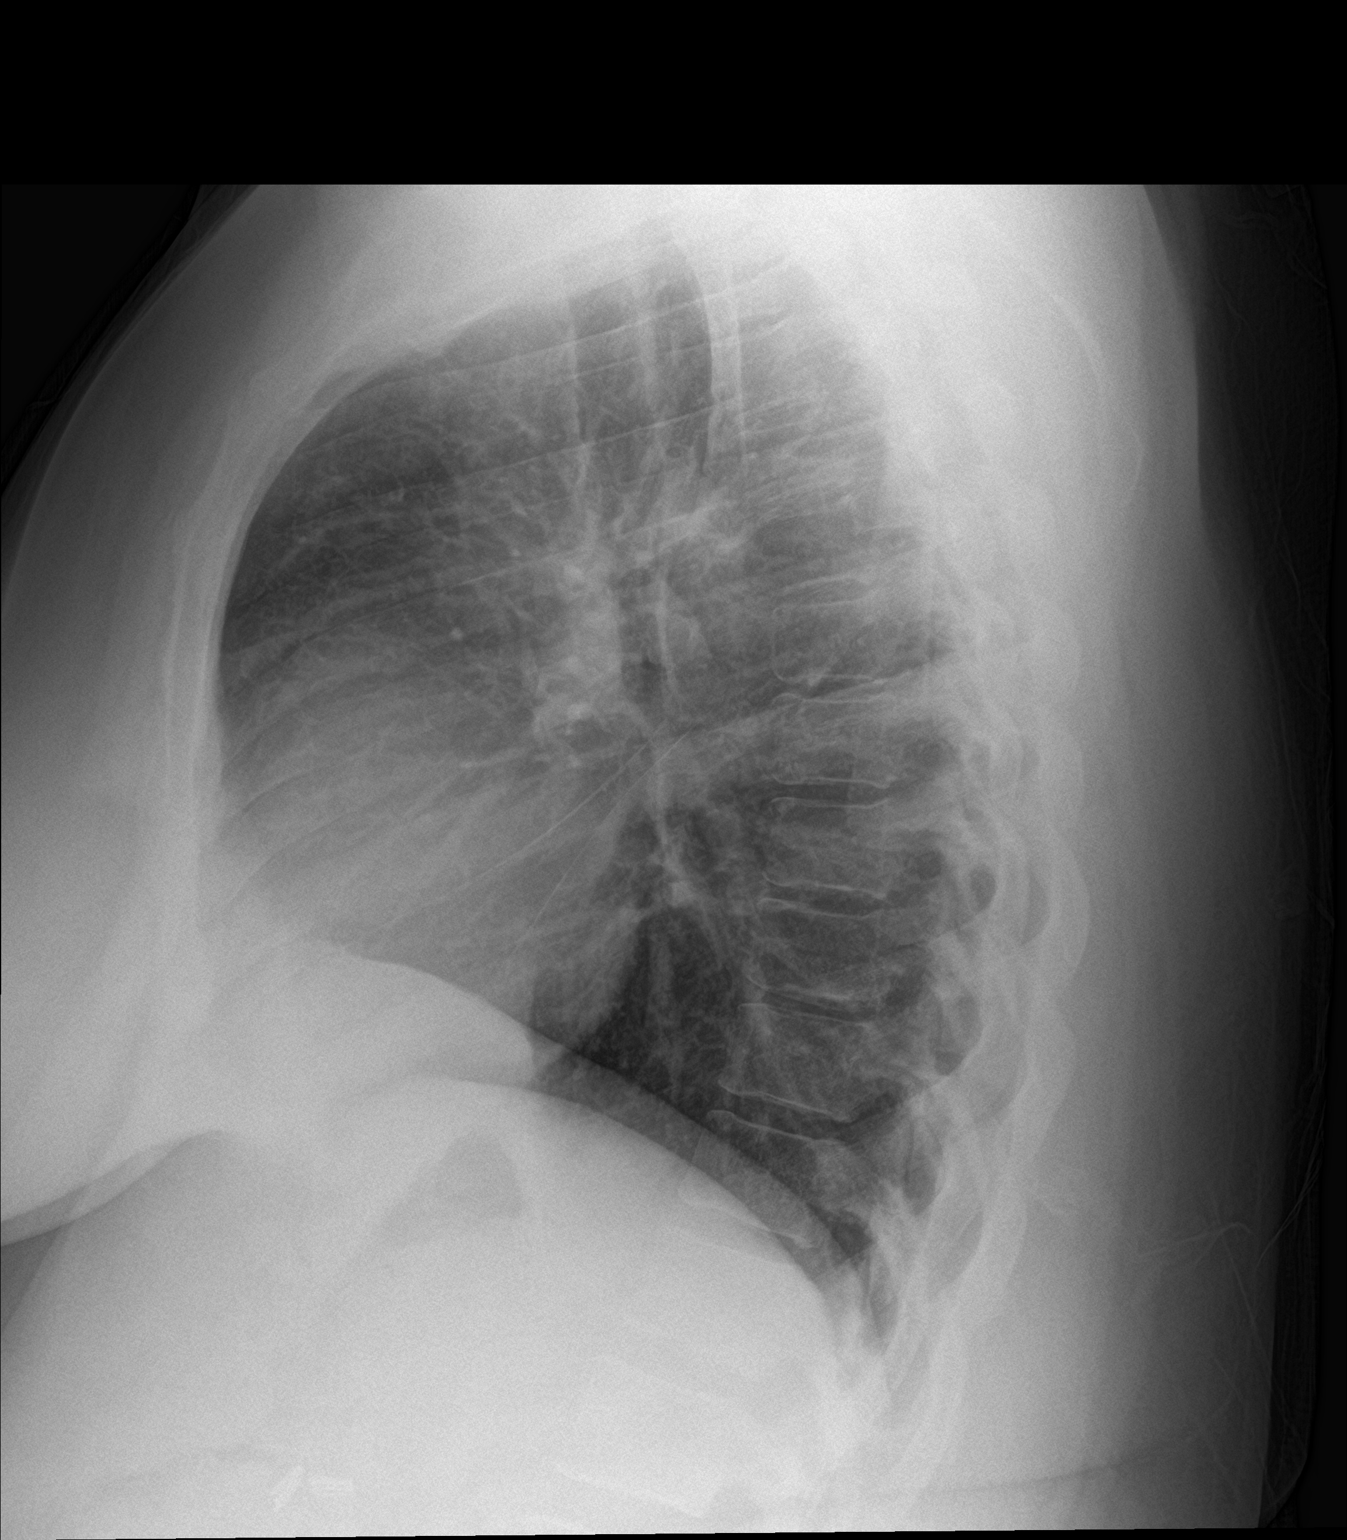

[2 of 2 positions shown; findings below may reference images not displayed]

FINDINGS: The heart size and mediastinal contours are within normal limits.
Both lungs are clear. The visualized skeletal structures are
unremarkable.
IMPRESSION: No active cardiopulmonary disease.

## 2020-01-13 ENCOUNTER — Ambulatory Visit: Payer: Self-pay | Attending: Internal Medicine

## 2020-01-13 DIAGNOSIS — Z23 Encounter for immunization: Secondary | ICD-10-CM

## 2020-01-13 NOTE — Progress Notes (Signed)
   Covid-19 Vaccination Clinic  Name:  [name]    MRN: [id number] DOB: 06-Jan-1981  01/13/2020  [name] was observed post Covid-19 immunization for 15 minutes without incident. She was provided with Vaccine Information Sheet and instruction to access the V-Safe system.   [name] was instructed to call 911 with any severe reactions post vaccine: Marland Kitchen Difficulty breathing  . Swelling of face and throat  . A fast heartbeat  . A bad rash all over body  . Dizziness and weakness   Immunizations Administered    Name Date Dose VIS Date Route   Pfizer COVID-19 Vaccine 01/13/2020 11:50 AM 0.3 mL 09/23/2019 Intramuscular   Manufacturer: [employer], [employer]   Lot: [id number]   [place]: [id number]

## 2020-02-06 ENCOUNTER — Ambulatory Visit: Payer: Self-pay | Attending: Internal Medicine

## 2020-02-06 DIAGNOSIS — Z23 Encounter for immunization: Secondary | ICD-10-CM

## 2020-02-06 NOTE — Progress Notes (Signed)
   Covid-19 Vaccination Clinic  Name:  [name]    MRN: [id number] DOB: November 30, 1980  02/06/2020  [name] was observed post Covid-19 immunization for 15 minutes without incident. She was provided with Vaccine Information Sheet and instruction to access the V-Safe system.   [name] was instructed to call 911 with any severe reactions post vaccine: Marland Kitchen Difficulty breathing  . Swelling of face and throat  . A fast heartbeat  . A bad rash all over body  . Dizziness and weakness   Immunizations Administered    Name Date Dose VIS Date Route   Pfizer COVID-19 Vaccine 02/06/2020 11:49 AM 0.3 mL 12/07/2018 Intramuscular   Manufacturer: [place]   Lot: [id number]   [place]: [id number]

## 2021-02-08 ENCOUNTER — Other Ambulatory Visit: Payer: Self-pay | Admitting: Obstetrics and Gynecology

## 2021-02-08 DIAGNOSIS — N920 Excessive and frequent menstruation with regular cycle: Secondary | ICD-10-CM

## 2021-02-12 ENCOUNTER — Other Ambulatory Visit: Payer: Self-pay

## 2021-02-14 ENCOUNTER — Ambulatory Visit
Admission: RE | Admit: 2021-02-14 | Discharge: 2021-02-14 | Disposition: A | Discharge status: Home / Self Care | Payer: BC Managed Care – PPO | Source: Ambulatory Visit | Attending: Obstetrics and Gynecology | Admitting: Obstetrics and Gynecology

## 2021-02-14 DIAGNOSIS — N920 Excessive and frequent menstruation with regular cycle: Secondary | ICD-10-CM

## 2021-05-13 DIAGNOSIS — K921 Melena: Secondary | ICD-10-CM

## 2021-05-13 HISTORY — DX: Melena: K92.1

## 2021-05-13 HISTORY — PX: OTHER SURGICAL HISTORY: SHX169

## 2021-07-12 ENCOUNTER — Other Ambulatory Visit: Payer: Self-pay | Admitting: Obstetrics and Gynecology

## 2021-08-30 ENCOUNTER — Other Ambulatory Visit: Payer: Self-pay

## 2021-08-30 ENCOUNTER — Encounter (HOSPITAL_BASED_OUTPATIENT_CLINIC_OR_DEPARTMENT_OTHER): Payer: Self-pay | Admitting: Obstetrics and Gynecology

## 2021-08-30 DIAGNOSIS — Z01812 Encounter for preprocedural laboratory examination: Secondary | ICD-10-CM | POA: Diagnosis present

## 2021-08-30 NOTE — Progress Notes (Signed)
Spoke w/ via phone for pre-op interview---pt Lab needs dos----   urine preg            Lab results------lab appt 09-10-2021 1000 am for cbc bmp t & s COVID test -----patient states asymptomatic no test needed Arrive at -------530 am 09-13-2021 NPO after MN NO Solid Food.  Clear liquids from MN until---430 am Med rec completed Medications to take morning of surgery -----none Diabetic medication -----n/a Patient instructed no nail polish to be worn day of surgery Patient instructed to bring photo id and insurance card day of surgery Patient aware to have Driver (ride ) / caregiver    for 24 hours after surgery  will arrange driver  Patient Special Instructions -----pt given overnight stay instructions Pre-Op special Istructions -----none Patient verbalized understanding of instructions that were given at this phone interview. Patient denies shortness of breath, chest pain, fever, cough at this phone interview.

## 2021-08-30 NOTE — Progress Notes (Signed)
Your procedure is scheduled on 09-13-2021  Report to [facility]   Call this number if you have problems the morning of surgery  :[phone].   OUR ADDRESS IS [place].  WE ARE LOCATED IN [facility].  PLEASE BRING YOUR INSURANCE CARD AND PHOTO ID DAY OF SURGERY.  ONLY ONE PERSON ALLOWED IN FACILITY WAITING AREA.                                     REMEMBER:  DO NOT EAT FOOD, CANDY GUM OR MINTS  AFTER MIDNIGHT . YOU MAY HAVE CLEAR LIQUIDS FROM MIDNIGHT UNTIL 430 AM. NO CLEAR LIQUIDS AFTER  430 AM DAY OF SURGERY.   YOU MAY  BRUSH YOUR TEETH MORNING OF SURGERY AND RINSE YOUR MOUTH OUT, NO CHEWING GUM CANDY OR MINTS.    CLEAR LIQUID DIET   Foods Allowed                                                                     Foods Excluded  Coffee and tea, regular and decaf                             liquids that you cannot  Plain Jell-O any favor except red or purple                                           see through such as: Fruit ices (not with fruit pulp)                                     milk, soups, orange juice  Iced Popsicles                                    All solid food Carbonated beverages, regular and diet                                    Cranberry, grape and apple juices Sports drinks like Gatorade Sugar  Sample Menu Breakfast                                Lunch                                     Supper Cranberry juice                                           Jell-O  Grape juice                           Apple juice Coffee or tea                        Jell-O                                      Popsicle                                                Coffee or tea                        Coffee or tea  _____________________________________________________________________     TAKE THESE MEDICATIONS MORNING OF SURGERY WITH A SIP OF WATER:  NONE  ONE  VISITOR IS ALLOWED IN WAITING ROOM ONLY DAY OF SURGERY.  YOU MAY HAVE ANOTHER PERSON SWITCH OUT WITH THE  1  VISITOR IN THE WAITING ROOM DAY OF SURGERY AND A MASK MUST BE WORN IN THE WAITING ROOM.    2 VISITORS  MAY VISIT IN THE EXTENDED RECOVERY ROOM UNTIL 800 PM ONLY 1 VISITOR AGE 54 AND OVER MAY SPEND THE NIGHT AND MUST BE IN EXTENDED RECOVERY ROOM NO LATER THAN 800 PM .   UP TO 2 CHILDREN AGE 49 TO 15 MAY ALSO VISIT IN EXTENDED RECOVERY ROOM ONLY UNTIL 800 PM AND MUST LEAVE BY 800 PM. ALL PERSONS VISITING IN EXTENDED RECOVERY ROOM MUST WEAR A MASK.                                    DO NOT WEAR JEWERLY, MAKE UP. DO NOT WEAR LOTIONS, POWDERS, PERFUMES OR NAIL POLISH ON YOUR FINGERNAILS. TOENAIL POLISH IS OK TO WEAR. DO NOT SHAVE FOR 48 HOURS PRIOR TO DAY OF SURGERY. MEN MAY SHAVE FACE AND NECK. CONTACTS, GLASSES, OR DENTURES MAY NOT BE WORN TO SURGERY.                                    [facility] IS NOT RESPONSIBLE  FOR ANY BELONGINGS.                                                                    Marland Kitchen           [facility] - Preparing for Surgery Before surgery, you can play an important role.  Because skin is not sterile, your skin needs to be as free of germs as possible.  You can reduce the number of germs on your skin by washing with CHG (chlorahexidine gluconate) soap before surgery.  CHG is an antiseptic cleaner which kills germs and bonds with the skin to continue killing germs even after washing. Please DO NOT use if you have an  allergy to CHG or antibacterial soaps.  If your skin becomes reddened/irritated stop using the CHG and inform your nurse when you arrive at Short Stay. Do not shave (including legs and underarms) for at least 48 hours prior to the first CHG shower.  You may shave your face/neck. Please follow these instructions carefully:  1.  Shower with CHG Soap the night before surgery and the  morning of Surgery.  2.  If you choose to wash your hair, wash your hair first  as usual with your  normal  shampoo.  3.  After you shampoo, rinse your hair and body thoroughly to remove the  shampoo.                            4.  Use CHG as you would any other liquid soap.  You can apply chg directly  to the skin and wash                      Gently with a scrungie or clean washcloth.  5.  Apply the CHG Soap to your body ONLY FROM THE NECK DOWN.   Do not use on face/ open                           Wound or open sores. Avoid contact with eyes, ears mouth and genitals (private parts).                       Wash face,  Genitals (private parts) with your normal soap.             6.  Wash thoroughly, paying special attention to the area where your surgery  will be performed.  7.  Thoroughly rinse your body with warm water from the neck down.  8.  DO NOT shower/wash with your normal soap after using and rinsing off  the CHG Soap.                9.  Pat yourself dry with a clean towel.            10.  Wear clean pajamas.            11.  Place clean sheets on your bed the night of your first shower and do not  sleep with pets. Day of Surgery : Do not apply any lotions/deodorants the morning of surgery.  Please wear clean clothes to the hospital/surgery center.  IF YOU HAVE ANY SKIN IRRITATION OR PROBLEMS WITH THE SURGICAL SOAP, PLEASE GET A BAR OF GOLD DIAL SOAP AND SHOWER THE NIGHT BEFORE YOUR SURGERY AND THE MORNING OF YOUR SURGERY. PLEASE LET THE NURSE KNOW MORNING OF YOUR SURGERY IF YOU HAD ANY PROBLEMS WITH THE SURGICAL SOAP.  FAILURE TO FOLLOW THESE INSTRUCTIONS MAY RESULT IN THE CANCELLATION OF YOUR SURGERY PATIENT SIGNATURE_________________________________  NURSE SIGNATURE__________________________________  ________________________________________________________________________                                                        QUESTIONS Hansel Feinstein PRE OP NURSE PHONE [phone].

## 2021-09-09 ENCOUNTER — Other Ambulatory Visit: Payer: Self-pay

## 2021-09-09 ENCOUNTER — Encounter (HOSPITAL_COMMUNITY)
Admission: RE | Admit: 2021-09-09 | Discharge: 2021-09-09 | Disposition: A | Discharge status: Home / Self Care | Payer: BC Managed Care – PPO | Source: Ambulatory Visit | Attending: Obstetrics and Gynecology | Admitting: Obstetrics and Gynecology

## 2021-09-09 DIAGNOSIS — D251 Intramural leiomyoma of uterus: Secondary | ICD-10-CM | POA: Diagnosis not present

## 2021-09-09 DIAGNOSIS — D259 Leiomyoma of uterus, unspecified: Secondary | ICD-10-CM | POA: Diagnosis present

## 2021-09-09 DIAGNOSIS — D509 Iron deficiency anemia, unspecified: Secondary | ICD-10-CM | POA: Diagnosis not present

## 2021-09-09 DIAGNOSIS — G43909 Migraine, unspecified, not intractable, without status migrainosus: Secondary | ICD-10-CM | POA: Diagnosis not present

## 2021-09-09 DIAGNOSIS — N920 Excessive and frequent menstruation with regular cycle: Secondary | ICD-10-CM | POA: Diagnosis present

## 2021-09-09 DIAGNOSIS — Z01812 Encounter for preprocedural laboratory examination: Secondary | ICD-10-CM | POA: Insufficient documentation

## 2021-09-09 DIAGNOSIS — Z6841 Body Mass Index (BMI) 40.0 and over, adult: Secondary | ICD-10-CM | POA: Diagnosis not present

## 2021-09-09 DIAGNOSIS — N8003 Adenomyosis of the uterus: Secondary | ICD-10-CM | POA: Diagnosis not present

## 2021-09-09 LAB — BASIC METABOLIC PANEL
Anion gap: 7 (ref 5–15)
BUN: 12 mg/dL (ref 6–20)
CO2: 25 mmol/L (ref 22–32)
Calcium: 8.6 mg/dL — ABNORMAL LOW (ref 8.9–10.3)
Chloride: 106 mmol/L (ref 98–111)
Creatinine, Ser: 0.63 mg/dL (ref 0.44–1.00)
GFR, Estimated: >60 mL/min (ref >60)
Glucose, Bld: 130 mg/dL — ABNORMAL HIGH (ref 70–99)
Potassium: 3.3 mmol/L — ABNORMAL LOW (ref 3.5–5.1)
Sodium: 138 mmol/L (ref 135–145)

## 2021-09-09 LAB — CBC
HCT: 40.9 % (ref 36.0–46.0)
Hemoglobin: 13 g/dL (ref 12.0–15.0)
MCH: 26.9 pg (ref 26.0–34.0)
MCHC: 31.8 g/dL (ref 30.0–36.0)
MCV: 84.7 fL (ref 80.0–100.0)
Platelets: 264 10*3/uL (ref 150–400)
RBC: 4.83 MIL/uL (ref 3.87–5.11)
RDW: 13.7 % (ref 11.5–15.5)
WBC: 6 10*3/uL (ref 4.0–10.5)
nRBC: 0 % (ref 0.0–0.2)

## 2021-09-10 ENCOUNTER — Encounter (HOSPITAL_COMMUNITY): Admission: RE | Admit: 2021-09-10 | Payer: BC Managed Care – PPO | Source: Ambulatory Visit

## 2021-09-13 ENCOUNTER — Other Ambulatory Visit: Payer: Self-pay

## 2021-09-13 ENCOUNTER — Observation Stay (HOSPITAL_BASED_OUTPATIENT_CLINIC_OR_DEPARTMENT_OTHER): Payer: BC Managed Care – PPO | Admitting: Anesthesiology

## 2021-09-13 ENCOUNTER — Ambulatory Visit (HOSPITAL_BASED_OUTPATIENT_CLINIC_OR_DEPARTMENT_OTHER)
Admission: RE | Admit: 2021-09-13 | Discharge: 2021-09-14 | Disposition: A | Discharge status: Home / Self Care | Payer: BC Managed Care – PPO | Attending: Obstetrics and Gynecology | Admitting: Obstetrics and Gynecology

## 2021-09-13 ENCOUNTER — Encounter (HOSPITAL_BASED_OUTPATIENT_CLINIC_OR_DEPARTMENT_OTHER): Payer: Self-pay | Admitting: Obstetrics and Gynecology

## 2021-09-13 ENCOUNTER — Encounter (HOSPITAL_BASED_OUTPATIENT_CLINIC_OR_DEPARTMENT_OTHER)
Admission: RE | Disposition: A | Discharge status: Home / Self Care | Payer: Self-pay | Source: Home / Self Care | Attending: Obstetrics and Gynecology

## 2021-09-13 DIAGNOSIS — D259 Leiomyoma of uterus, unspecified: Secondary | ICD-10-CM | POA: Diagnosis present

## 2021-09-13 DIAGNOSIS — D251 Intramural leiomyoma of uterus: Secondary | ICD-10-CM | POA: Diagnosis not present

## 2021-09-13 DIAGNOSIS — N920 Excessive and frequent menstruation with regular cycle: Secondary | ICD-10-CM | POA: Insufficient documentation

## 2021-09-13 DIAGNOSIS — Z01818 Encounter for other preprocedural examination: Secondary | ICD-10-CM

## 2021-09-13 DIAGNOSIS — N8003 Adenomyosis of the uterus: Secondary | ICD-10-CM | POA: Insufficient documentation

## 2021-09-13 DIAGNOSIS — Z90710 Acquired absence of both cervix and uterus: Secondary | ICD-10-CM | POA: Diagnosis present

## 2021-09-13 DIAGNOSIS — D509 Iron deficiency anemia, unspecified: Secondary | ICD-10-CM | POA: Insufficient documentation

## 2021-09-13 DIAGNOSIS — Z6841 Body Mass Index (BMI) 40.0 and over, adult: Secondary | ICD-10-CM | POA: Insufficient documentation

## 2021-09-13 DIAGNOSIS — G43909 Migraine, unspecified, not intractable, without status migrainosus: Secondary | ICD-10-CM | POA: Insufficient documentation

## 2021-09-13 HISTORY — DX: Carpal tunnel syndrome, bilateral upper limbs: G56.03

## 2021-09-13 HISTORY — DX: Anemia, unspecified: D64.9

## 2021-09-13 HISTORY — PX: ROBOTIC ASSISTED LAPAROSCOPIC HYSTERECTOMY AND SALPINGECTOMY: SHX6379

## 2021-09-13 LAB — TYPE AND SCREEN
ABO/RH(D): B POS
Antibody Screen: NEGATIVE

## 2021-09-13 LAB — POCT PREGNANCY, URINE: Preg Test, Ur: NEGATIVE

## 2021-09-13 LAB — ABO/RH: ABO/RH(D): B POS

## 2021-09-13 SURGERY — XI ROBOTIC ASSISTED LAPAROSCOPIC HYSTERECTOMY AND SALPINGECTOMY
Anesthesia: General

## 2021-09-13 MED ORDER — PROPOFOL 10 MG/ML IV BOLUS
INTRAVENOUS | Status: DC | PRN
  Administered 2021-09-13: 250 mg via INTRAVENOUS

## 2021-09-13 MED ORDER — ONDANSETRON HCL 4 MG/2ML IJ SOLN: 4.0000 mg | Freq: Four times a day (QID) | INTRAMUSCULAR | Status: DC | PRN

## 2021-09-13 MED ORDER — WHITE PETROLATUM EX OINT
TOPICAL_OINTMENT | CUTANEOUS | Status: AC
  Filled 2021-09-13: qty 5

## 2021-09-13 MED ORDER — EPHEDRINE 5 MG/ML INJ
INTRAVENOUS | Status: AC
  Filled 2021-09-13: qty 5

## 2021-09-13 MED ORDER — OXYCODONE HCL 5 MG/5ML PO SOLN: 5.0000 mg | Freq: Once | ORAL | Status: DC | PRN

## 2021-09-13 MED ORDER — KETOROLAC TROMETHAMINE 30 MG/ML IJ SOLN
INTRAMUSCULAR | Status: DC | PRN
  Administered 2021-09-13: 30 mg via INTRAVENOUS

## 2021-09-13 MED ORDER — MIDAZOLAM HCL 2 MG/2ML IJ SOLN
INTRAMUSCULAR | Status: AC
  Filled 2021-09-13: qty 2

## 2021-09-13 MED ORDER — DEXMEDETOMIDINE (PRECEDEX) IN NS 20 MCG/5ML (4 MCG/ML) IV SYRINGE
PREFILLED_SYRINGE | INTRAVENOUS | Status: AC
  Filled 2021-09-13: qty 10

## 2021-09-13 MED ORDER — SUGAMMADEX SODIUM 200 MG/2ML IV SOLN
INTRAVENOUS | Status: DC | PRN
  Administered 2021-09-13: 400 mg via INTRAVENOUS

## 2021-09-13 MED ORDER — POVIDONE-IODINE 10 % EX SWAB
2.0000 "application " | Freq: Once | CUTANEOUS | Status: AC
  Administered 2021-09-13: 2 via TOPICAL

## 2021-09-13 MED ORDER — FENTANYL CITRATE (PF) 250 MCG/5ML IJ SOLN
INTRAMUSCULAR | Status: DC | PRN
  Administered 2021-09-13: 100 ug via INTRAVENOUS
  Administered 2021-09-13: 50 ug via INTRAVENOUS

## 2021-09-13 MED ORDER — RIZATRIPTAN BENZOATE 5 MG PO TBDP: 5.0000 mg | ORAL_TABLET | ORAL | Status: DC | PRN

## 2021-09-13 MED ORDER — EPHEDRINE SULFATE 50 MG/ML IJ SOLN
INTRAMUSCULAR | Status: DC | PRN
  Administered 2021-09-13: 10 mg via INTRAVENOUS

## 2021-09-13 MED ORDER — ACETAMINOPHEN 500 MG PO TABS
ORAL_TABLET | ORAL | Status: AC
  Filled 2021-09-13: qty 2

## 2021-09-13 MED ORDER — IBUPROFEN 200 MG PO TABS: 600.0000 mg | ORAL_TABLET | Freq: Four times a day (QID) | ORAL | Status: DC

## 2021-09-13 MED ORDER — ACETAMINOPHEN 500 MG PO TABS: 1000.0000 mg | ORAL_TABLET | Freq: Once | ORAL | Status: DC

## 2021-09-13 MED ORDER — SODIUM CHLORIDE 0.9 % IR SOLN
Status: DC | PRN
  Administered 2021-09-13: 1000 mL

## 2021-09-13 MED ORDER — LIDOCAINE 2% (20 MG/ML) 5 ML SYRINGE
INTRAMUSCULAR | Status: DC | PRN
  Administered 2021-09-13: 100 mg via INTRAVENOUS

## 2021-09-13 MED ORDER — KETOROLAC TROMETHAMINE 30 MG/ML IJ SOLN
INTRAMUSCULAR | Status: AC
  Filled 2021-09-13: qty 1

## 2021-09-13 MED ORDER — DEXAMETHASONE SODIUM PHOSPHATE 10 MG/ML IJ SOLN
INTRAMUSCULAR | Status: AC
  Filled 2021-09-13: qty 1

## 2021-09-13 MED ORDER — ROCURONIUM BROMIDE 10 MG/ML (PF) SYRINGE
PREFILLED_SYRINGE | INTRAVENOUS | Status: DC | PRN
  Administered 2021-09-13: 20 mg via INTRAVENOUS
  Administered 2021-09-13: 80 mg via INTRAVENOUS
  Administered 2021-09-13: 20 mg via INTRAVENOUS

## 2021-09-13 MED ORDER — PANTOPRAZOLE SODIUM 40 MG PO TBEC
DELAYED_RELEASE_TABLET | ORAL | Status: AC
  Filled 2021-09-13: qty 1

## 2021-09-13 MED ORDER — OXYCODONE HCL 5 MG PO TABS
5.0000 mg | ORAL_TABLET | ORAL | Status: DC | PRN
  Administered 2021-09-13 – 2021-09-14 (×6): 10 mg via ORAL

## 2021-09-13 MED ORDER — CEFAZOLIN SODIUM-DEXTROSE 2-4 GM/100ML-% IV SOLN
INTRAVENOUS | Status: AC
  Filled 2021-09-13: qty 100

## 2021-09-13 MED ORDER — BUPIVACAINE HCL (PF) 0.25 % IJ SOLN
INTRAMUSCULAR | Status: DC | PRN
  Administered 2021-09-13: 10 mL

## 2021-09-13 MED ORDER — ONDANSETRON HCL 4 MG/2ML IJ SOLN
INTRAMUSCULAR | Status: DC | PRN
  Administered 2021-09-13: 4 mg via INTRAVENOUS

## 2021-09-13 MED ORDER — ONDANSETRON HCL 4 MG PO TABS: 4.0000 mg | ORAL_TABLET | Freq: Four times a day (QID) | ORAL | Status: DC | PRN

## 2021-09-13 MED ORDER — SIMETHICONE 80 MG PO CHEW
80.0000 mg | CHEWABLE_TABLET | Freq: Four times a day (QID) | ORAL | Status: DC | PRN
  Administered 2021-09-13 (×2): 80 mg via ORAL

## 2021-09-13 MED ORDER — GABAPENTIN 600 MG PO TABS
300.0000 mg | ORAL_TABLET | ORAL | Status: AC
  Administered 2021-09-13: 300 mg via ORAL
  Filled 2021-09-13: qty 0.5

## 2021-09-13 MED ORDER — ACETAMINOPHEN 500 MG PO TABS
1000.0000 mg | ORAL_TABLET | Freq: Four times a day (QID) | ORAL | Status: DC
  Administered 2021-09-13 – 2021-09-14 (×4): 1000 mg via ORAL

## 2021-09-13 MED ORDER — CEFAZOLIN SODIUM-DEXTROSE 2-4 GM/100ML-% IV SOLN
2.0000 g | Freq: Three times a day (TID) | INTRAVENOUS | Status: DC
  Administered 2021-09-13: 2 g via INTRAVENOUS

## 2021-09-13 MED ORDER — OXYCODONE HCL 5 MG PO TABS
ORAL_TABLET | ORAL | Status: AC
  Filled 2021-09-13: qty 2

## 2021-09-13 MED ORDER — PANTOPRAZOLE SODIUM 40 MG PO TBEC
40.0000 mg | DELAYED_RELEASE_TABLET | Freq: Every day | ORAL | Status: DC
  Administered 2021-09-13: 40 mg via ORAL

## 2021-09-13 MED ORDER — KETOROLAC TROMETHAMINE 30 MG/ML IJ SOLN
30.0000 mg | Freq: Four times a day (QID) | INTRAMUSCULAR | Status: DC
  Administered 2021-09-13 – 2021-09-14 (×3): 30 mg via INTRAVENOUS

## 2021-09-13 MED ORDER — GABAPENTIN 300 MG PO CAPS
ORAL_CAPSULE | ORAL | Status: AC
  Filled 2021-09-13: qty 1

## 2021-09-13 MED ORDER — LIDOCAINE 2% (20 MG/ML) 5 ML SYRINGE
INTRAMUSCULAR | Status: AC
  Filled 2021-09-13: qty 5

## 2021-09-13 MED ORDER — PROPOFOL 10 MG/ML IV BOLUS
INTRAVENOUS | Status: AC
  Filled 2021-09-13: qty 20

## 2021-09-13 MED ORDER — OXYCODONE HCL 5 MG PO TABS: 5.0000 mg | ORAL_TABLET | Freq: Once | ORAL | Status: DC | PRN

## 2021-09-13 MED ORDER — ONDANSETRON HCL 4 MG/2ML IJ SOLN
INTRAMUSCULAR | Status: AC
  Filled 2021-09-13: qty 2

## 2021-09-13 MED ORDER — SIMETHICONE 80 MG PO CHEW
CHEWABLE_TABLET | ORAL | Status: AC
  Filled 2021-09-13: qty 1

## 2021-09-13 MED ORDER — DEXTROSE IN LACTATED RINGERS 5 % IV SOLN: INTRAVENOUS | Status: DC

## 2021-09-13 MED ORDER — MENTHOL 3 MG MT LOZG: 1.0000 | LOZENGE | OROMUCOSAL | Status: DC | PRN

## 2021-09-13 MED ORDER — DEXAMETHASONE SODIUM PHOSPHATE 10 MG/ML IJ SOLN
INTRAMUSCULAR | Status: DC | PRN
  Administered 2021-09-13: 10 mg via INTRAVENOUS

## 2021-09-13 MED ORDER — KETOROLAC TROMETHAMINE 30 MG/ML IJ SOLN: 30.0000 mg | Freq: Once | INTRAMUSCULAR | Status: DC | PRN

## 2021-09-13 MED ORDER — FENTANYL CITRATE (PF) 250 MCG/5ML IJ SOLN
INTRAMUSCULAR | Status: AC
  Filled 2021-09-13: qty 5

## 2021-09-13 MED ORDER — LACTATED RINGERS IV SOLN
INTRAVENOUS | Status: DC
  Administered 2021-09-13: 1000 mL via INTRAVENOUS

## 2021-09-13 MED ORDER — DEXMEDETOMIDINE (PRECEDEX) IN NS 20 MCG/5ML (4 MCG/ML) IV SYRINGE
PREFILLED_SYRINGE | INTRAVENOUS | Status: DC | PRN
  Administered 2021-09-13: 8 ug via INTRAVENOUS
  Administered 2021-09-13: 4 ug via INTRAVENOUS
  Administered 2021-09-13: 8 ug via INTRAVENOUS

## 2021-09-13 MED ORDER — MIDAZOLAM HCL 5 MG/5ML IJ SOLN
INTRAMUSCULAR | Status: DC | PRN
  Administered 2021-09-13: 2 mg via INTRAVENOUS

## 2021-09-13 MED ORDER — ACETAMINOPHEN 325 MG PO TABS
ORAL_TABLET | ORAL | Status: DC | PRN
  Administered 2021-09-13: 1000 mg via ORAL

## 2021-09-13 MED ORDER — PROMETHAZINE HCL 25 MG/ML IJ SOLN: 6.2500 mg | INTRAMUSCULAR | Status: DC | PRN

## 2021-09-13 MED ORDER — ROCURONIUM BROMIDE 10 MG/ML (PF) SYRINGE
PREFILLED_SYRINGE | INTRAVENOUS | Status: AC
  Filled 2021-09-13: qty 10

## 2021-09-13 MED ORDER — SUMATRIPTAN SUCCINATE 50 MG PO TABS
50.0000 mg | ORAL_TABLET | ORAL | Status: DC | PRN
  Filled 2021-09-13: qty 1

## 2021-09-13 MED ORDER — HYDROMORPHONE HCL 1 MG/ML IJ SOLN: 0.2500 mg | INTRAMUSCULAR | Status: DC | PRN

## 2021-09-13 SURGICAL SUPPLY — 62 items
ADH SKN CLS APL DERMABOND .7 (GAUZE/BANDAGES/DRESSINGS) ×1
APL SRG 38 LTWT LNG FL B (MISCELLANEOUS) ×1
APPLICATOR ARISTA FLEXITIP XL (MISCELLANEOUS) ×2 IMPLANT
BARRIER ADHS 3X4 INTERCEED (GAUZE/BANDAGES/DRESSINGS) IMPLANT
BRR ADH 4X3 ABS CNTRL BYND (GAUZE/BANDAGES/DRESSINGS)
CATH FOLEY 3WAY  5CC 16FR (CATHETERS) ×2
CATH FOLEY 3WAY 5CC 16FR (CATHETERS) ×1 IMPLANT
COVER BACK TABLE 60X90IN (DRAPES) ×2 IMPLANT
COVER TIP SHEARS 8 DVNC (MISCELLANEOUS) ×1 IMPLANT
COVER TIP SHEARS 8MM DA VINCI (MISCELLANEOUS) ×2
DECANTER SPIKE VIAL GLASS SM (MISCELLANEOUS) ×2 IMPLANT
DEFOGGER SCOPE WARMER CLEARIFY (MISCELLANEOUS) ×2 IMPLANT
DERMABOND ADVANCED (GAUZE/BANDAGES/DRESSINGS) ×1
DERMABOND ADVANCED .7 DNX12 (GAUZE/BANDAGES/DRESSINGS) ×1 IMPLANT
DRAPE ARM DVNC X/XI (DISPOSABLE) ×4 IMPLANT
DRAPE COLUMN DVNC XI (DISPOSABLE) ×1 IMPLANT
DRAPE DA VINCI XI ARM (DISPOSABLE) ×8
DRAPE DA VINCI XI COLUMN (DISPOSABLE) ×2
DRAPE UTILITY XL STRL (DRAPES) ×2 IMPLANT
DURAPREP 26ML APPLICATOR (WOUND CARE) ×2 IMPLANT
ELECT REM PT RETURN 9FT ADLT (ELECTROSURGICAL) ×2
ELECTRODE REM PT RTRN 9FT ADLT (ELECTROSURGICAL) ×1 IMPLANT
GAUZE 4X4 16PLY ~~LOC~~+RFID DBL (SPONGE) ×4 IMPLANT
GLOVE SURG LTX SZ6.5 (GLOVE) ×6 IMPLANT
GLOVE SURG UNDER POLY LF SZ7 (GLOVE) ×10 IMPLANT
HEMOSTAT ARISTA ABSORB 3G PWDR (HEMOSTASIS) ×2 IMPLANT
HOLDER FOLEY CATH W/STRAP (MISCELLANEOUS) IMPLANT
IRRIG SUCT STRYKERFLOW 2 WTIP (MISCELLANEOUS) ×2
IRRIGATION SUCT STRKRFLW 2 WTP (MISCELLANEOUS) ×1 IMPLANT
KIT TURNOVER CYSTO (KITS) ×2 IMPLANT
LEGGING LITHOTOMY PAIR STRL (DRAPES) ×2 IMPLANT
NEEDLE INSUFFLATION 120MM (ENDOMECHANICALS) ×2 IMPLANT
OBTURATOR OPTICAL STANDARD 8MM (TROCAR) ×2
OBTURATOR OPTICAL STND 8 DVNC (TROCAR) ×1
OBTURATOR OPTICALSTD 8 DVNC (TROCAR) ×1 IMPLANT
OCCLUDER COLPOPNEUMO (BALLOONS) ×2 IMPLANT
PACK ROBOT WH (CUSTOM PROCEDURE TRAY) ×2 IMPLANT
PACK ROBOTIC GOWN (GOWN DISPOSABLE) ×2 IMPLANT
PACK TRENDGUARD 450 HYBRID PRO (MISCELLANEOUS) ×1 IMPLANT
PAD OB MATERNITY 4.3X12.25 (PERSONAL CARE ITEMS) ×2 IMPLANT
PAD PREP 24X48 CUFFED NSTRL (MISCELLANEOUS) ×2 IMPLANT
PROTECTOR NERVE ULNAR (MISCELLANEOUS) ×4 IMPLANT
SEAL CANN UNIV 5-8 DVNC XI (MISCELLANEOUS) ×4 IMPLANT
SEAL XI 5MM-8MM UNIVERSAL (MISCELLANEOUS) ×8
SEALER VESSEL DA VINCI XI (MISCELLANEOUS) ×2
SEALER VESSEL EXT DVNC XI (MISCELLANEOUS) ×1 IMPLANT
SET IRRIG Y TYPE TUR BLADDER L (SET/KITS/TRAYS/PACK) IMPLANT
SET TRI-LUMEN FLTR TB AIRSEAL (TUBING) ×2 IMPLANT
SPONGE T-LAP 4X18 ~~LOC~~+RFID (SPONGE) IMPLANT
SUT VIC AB 0 CT1 36 (SUTURE) ×4 IMPLANT
SUT VIC AB 4-0 PS2 18 (SUTURE) ×4 IMPLANT
SUT VLOC 180 0 9IN  GS21 (SUTURE) ×4
SUT VLOC 180 0 9IN GS21 (SUTURE) ×2 IMPLANT
TIP RUMI ORANGE 6.7MMX12CM (TIP) IMPLANT
TIP UTERINE 5.1X6CM LAV DISP (MISCELLANEOUS) IMPLANT
TIP UTERINE 6.7X10CM GRN DISP (MISCELLANEOUS) IMPLANT
TIP UTERINE 6.7X6CM WHT DISP (MISCELLANEOUS) IMPLANT
TIP UTERINE 6.7X8CM BLUE DISP (MISCELLANEOUS) ×2 IMPLANT
TOWEL OR 17X26 10 PK STRL BLUE (TOWEL DISPOSABLE) ×2 IMPLANT
TRENDGUARD 450 HYBRID PRO PACK (MISCELLANEOUS) ×2
TROCAR PORT AIRSEAL 8X120 (TROCAR) ×2 IMPLANT
WATER STERILE IRR 1000ML POUR (IV SOLUTION) ×2 IMPLANT

## 2021-09-13 NOTE — Anesthesia Preprocedure Evaluation (Signed)
Anesthesia Evaluation  Patient identified by MRN, date of birth, ID band Patient awake    Reviewed: Allergy & Precautions, H&P , NPO status , Patient's Chart, lab work & pertinent test results  Airway Mallampati: II  TM Distance: <3 FB Neck ROM: Full    Dental no notable dental hx.    Pulmonary neg pulmonary ROS,    Pulmonary exam normal breath sounds clear to auscultation       Cardiovascular negative cardio ROS Normal cardiovascular exam Rhythm:Regular Rate:Normal     Neuro/Psych  Headaches, negative psych ROS   GI/Hepatic negative GI ROS, Neg liver ROS,   Endo/Other  Morbid obesity  Renal/GU negative Renal ROS  negative genitourinary   Musculoskeletal negative musculoskeletal ROS (+)   Abdominal   Peds negative pediatric ROS (+)  Hematology negative hematology ROS (+)   Anesthesia Other Findings   Reproductive/Obstetrics negative OB ROS                             Anesthesia Physical Anesthesia Plan  ASA: 2  Anesthesia Plan: General   Post-op Pain Management:    Induction: Intravenous  PONV Risk Score and Plan: 3 and Ondansetron, Dexamethasone, Midazolam and Treatment may vary due to age or medical condition  Airway Management Planned: Oral ETT  Additional Equipment:   Intra-op Plan:   Post-operative Plan: Extubation in OR  Informed Consent: I have reviewed the patients History and Physical, chart, labs and discussed the procedure including the risks, benefits and alternatives for the proposed anesthesia with the patient or authorized representative who has indicated his/her understanding and acceptance.     Dental advisory given  Plan Discussed with: CRNA and Surgeon  Anesthesia Plan Comments:         Anesthesia Quick Evaluation

## 2021-09-13 NOTE — Brief Op Note (Signed)
09/13/2021  10:36 AM  PATIENT:  [name]  40 y.o. female  PRE-OPERATIVE DIAGNOSIS:  symptomatic uterine fibroid, iron defiency anemia  POST-OPERATIVE DIAGNOSIS:  symptomatic uterine fibroid, iron defiency anemia  PROCEDURE:  43 XI robotic total hysterectomy, bilateral salpingectomy  SURGEON:  Surgeon(s) and Role:    * Servando Salina, MD - Primary  PHYSICIAN ASSISTANT:   ASSISTANTS: Gaylord Shih RNFA   ANESTHESIA:   general FINDINGS;  Enlarged uterus ( 253 g), surgical separated tubes, nl ovaries EBL:  25 mL   BLOOD ADMINISTERED:none  DRAINS: none   LOCAL MEDICATIONS USED:  MARCAINE     SPECIMEN:  Source of Specimen:  uterus with cervix, tubes  DISPOSITION OF SPECIMEN:  PATHOLOGY  COUNTS:  YES  TOURNIQUET:  * No tourniquets in log *  DICTATION: .Other Dictation: Dictation Number [id number]  PLAN OF CARE: Admit for overnight observation  PATIENT DISPOSITION:  PACU - hemodynamically stable.   Delay start of Pharmacological VTE agent (>24hrs) due to surgical blood loss or risk of bleeding: no

## 2021-09-13 NOTE — Transfer of Care (Signed)
Immediate Anesthesia Transfer of Care Note  Patient: [name]  Procedure(s) Performed: XI ROBOTIC ASSISTED LAPAROSCOPIC HYSTERECTOMY AND SALPINGECTOMY  Patient Location: PACU  Anesthesia Type:General  Level of Consciousness: sedated  Airway & Oxygen Therapy: Patient Spontanous Breathing and Patient connected to nasal cannula oxygen  Post-op Assessment: Report given to RN  Post vital signs: Reviewed and stable  Last Vitals:  Vitals Value Taken Time  BP 144/89 09/13/21 1037  Temp    Pulse 69 09/13/21 1039  Resp 13 09/13/21 1039  SpO2 100 % 09/13/21 1039  Vitals shown include unvalidated device data.  Last Pain:  Vitals:   09/13/21 0618  TempSrc:   PainSc: 3       Patients Stated Pain Goal: 7 (62/83/66 2947)  Complications: No notable events documented.

## 2021-09-13 NOTE — Anesthesia Procedure Notes (Signed)
Procedure Name: Intubation Date/Time: 09/13/2021 7:46 AM Performed by: Bonney Aid, CRNA Pre-anesthesia Checklist: Patient identified, Emergency Drugs available, Suction available and Patient being monitored Patient Re-evaluated:Patient Re-evaluated prior to induction Oxygen Delivery Method: Circle system utilized Preoxygenation: Pre-oxygenation with 100% oxygen Induction Type: IV induction Ventilation: Mask ventilation without difficulty Laryngoscope Size: Mac and 3 Grade View: Grade I Tube type: Oral Tube size: 7.0 mm Number of attempts: 1 Airway Equipment and Method: Stylet Placement Confirmation: ETT inserted through vocal cords under direct vision, positive ETCO2 and breath sounds checked- equal and bilateral Secured at: 20 cm Tube secured with: Tape Dental Injury: Teeth and Oropharynx as per pre-operative assessment

## 2021-09-13 NOTE — Anesthesia Postprocedure Evaluation (Signed)
Anesthesia Post Note  Patient: [name]  Procedure(s) Performed: XI ROBOTIC ASSISTED LAPAROSCOPIC HYSTERECTOMY AND SALPINGECTOMY     Patient location during evaluation: PACU Anesthesia Type: General Level of consciousness: awake and alert Pain management: pain level controlled Vital Signs Assessment: post-procedure vital signs reviewed and stable Respiratory status: spontaneous breathing, nonlabored ventilation, respiratory function stable and patient connected to nasal cannula oxygen Cardiovascular status: blood pressure returned to baseline and stable Postop Assessment: no apparent nausea or vomiting Anesthetic complications: no   No notable events documented.  Last Vitals:  Vitals:   09/13/21 1140 09/13/21 1200  BP: (!) 142/93 136/72  Pulse: 83 75  Resp:  18  Temp: (!) 36.3 C   SpO2: 99% 98%    Last Pain:  Vitals:   09/13/21 1140  TempSrc:   PainSc: 7                  Skye Plamondon S

## 2021-09-13 NOTE — H&P (Signed)
[name] is an 40 y.o. female. G2P2 BF hx TL presents for davinci robotic total hysterectomy, bilateral tubal ligation due to symptomatic uterine fibroids. Pt had menorrhagia with associated iDA requiring iron transfusion. Pt also was given lupron treatment with good response Pertinent Gynecological History: Menses: flow is excessive with use of 6 pads or tampons on heaviest days Bleeding: menorrhagia Contraception: tubal ligation DES exposure: denies Blood transfusions: none Sexually transmitted diseases: no past history Previous GYN Procedures:  TL   Last mammogram: normal Date: 2022 Last pap: normal Date: 2022 OB History: G2, P2   Menstrual History: Menarche age: n/Isabella Patient's last menstrual period was 09/13/2021.    Past Medical History:  Diagnosis Date   Anemia    last iron infusion 06-2021 @ [facility] per dr Geniece Akers   Bilateral carpal tunnel syndrome    Blood in stool, frank 05/2021   normal results with colonscopy   Migraines    with aura when cough or sneezes  or neck pain    Past Surgical History:  Procedure Laterality Date   CHOLECYSTECTOMY     18 yrs ago per pt on 08-30-2021   colonscopy  05/2021   right carpal tunnel release Right 2014   TUBAL LIGATION     15 yrs ago per pt on 08-30-2021    History reviewed. No pertinent family history.  Social History:  reports that she has never smoked. She has never used smokeless tobacco. She reports current alcohol use. She reports that she does not use drugs.  Allergies: No Known Allergies  Medications Prior to Admission  Medication Sig Dispense Refill Last Dose   acetaminophen (TYLENOL) 500 MG tablet Take 1,000 mg by mouth every 6 (six) hours as needed.   Past Week   ibuprofen (ADVIL) 800 MG tablet Take 800 mg by mouth every 8 (eight) hours as needed.   09/06/2021   NON FORMULARY Take 2 tablets by mouth daily as needed. Emergent-c   Past Week   Semaglutide (OZEMPIC, 0.25 OR 0.5 MG/DOSE, [place])  Inject into the skin once Isabella week. 0.25 weekly tuesday   09/10/2021   phentermine 37.5 MG capsule Take 37.5 mg by mouth every morning.   More than Isabella month   rizatriptan (MAXALT-MLT) 5 MG disintegrating tablet Take 5 mg by mouth as needed for migraine. May repeat in 2 hours if needed   More than Isabella month    Review of Systems  Blood pressure (!) 156/102, pulse 86, temperature 98.2 F (36.8 C), temperature source Oral, resp. rate 18, height 5\' 4"  (1.626 m), weight 113.5 kg, last menstrual period 09/13/2021, SpO2 97 %. Physical Exam Constitutional:      Appearance: Normal appearance. She is obese.  Eyes:     Extraocular Movements: Extraocular movements intact.  Cardiovascular:     Rate and Rhythm: Regular rhythm.     Heart sounds: Normal heart sounds.  Pulmonary:     Breath sounds: Normal breath sounds.  Abdominal:     Palpations: Abdomen is soft.  Genitourinary:    General: Normal vulva.     Comments: Vagina scant discharge Cervix parous Uterus 8-10 wk size  Adnexa no palp mass Musculoskeletal:        General: Normal range of motion.     Cervical back: Neck supple.  Skin:    General: Skin is warm and dry.  Neurological:     General: No focal deficit present.     Mental Status: She is oriented to person, place, and  time.  Psychiatric:        Mood and Affect: Mood normal.        Behavior: Behavior normal.    Results for orders placed or performed during the hospital encounter of 09/13/21 (from the past 24 hour(s))  Pregnancy, urine POC     Status: None   Collection Time: 09/13/21  5:48 AM  Result Value Ref Range   Preg Test, Ur NEGATIVE NEGATIVE    No results found.  Assessment/Plan: Symptomatic uterine fibroid P) Da Vinci robotic total hysterectomy, bilateral salpingectomy. Procedure explained. Risk of surgery reviewed including infection, bleeding, injury to surrounding organ structures, internal scar tissue, possible need for blood transfusion and its risk, need to  convert to open case, poss need for ovarian surgery in the future due to ovarian diseases. All ? answered  [name] [name] 09/13/2021, 6:55 AM

## 2021-09-13 NOTE — Op Note (Signed)
[name], [name] MEDICAL RECORD NO: [id number] ACCOUNT NO: [id number] DATE OF BIRTH: 04/22/1981 FACILITY: [facility] LOCATION: [facility] PHYSICIAN: Tinita Brooker A. Garwin Brothers, MD  Operative Report   DATE OF PROCEDURE: 09/13/2021  PREOPERATIVE DIAGNOSIS:  Symptomatic uterine fibroids.  PROCEDURE PERFORMED:  DaVinci robotic total hysterectomy, bilateral salpingectomy.  POSTOPERATIVE DIAGNOSIS: Symptomatic uterine fibroids  ANESTHESIA:  General.  SURGEON:  Rasheka Denard A. Garwin Brothers, MD  ASSISTANT:  Gaylord Shih, RNFA  DESCRIPTION OF PROCEDURE:  Under adequate general anesthesia, the patient was placed in the dorsal lithotomy position.  She was positioned for robotic surgery.  The patient had two large pads soaked with blood with ongoing menstrual cycle. The patient  was sterilely prepped and draped in the usual fashion.  A 3-way Foley catheter was sterilely placed.  Examination under anesthesia revealed about a 10-week size uterus.  No adnexal masses could be appreciated, but limited by the patient's body habitus.   The patient has had Lupron therapy for several months.  A weighted speculum was placed in the vagina.  Sims retractor was placed anteriorly.  The cervix was noted to be parous.  A 0 Vicryl figure-of-eight suture was placed on the anterior and posterior  lip of the cervix.  The uterus sounded to 9 cm.  An 8 mm uterine manipulator with a medium KOH RUMI cup was inserted without difficulty.  The retractors were removed.  Attention was then turned to the abdomen.  A small infraumbilical incision was made  after 0.25% Marcaine was injected.  Veress needle was introduced and tested with sterile water.  Opening pressure of 4 was noted. 4.6 liters of CO2 was insufflated.  Veress needle was then removed and a robotic camera port was then inserted without  difficulty.  Lighted video robotic camera laparoscope was inserted.  Entry into the abdomen was without any incident.  The patient was then  placed in deep Trendelenburg position and inspection panoramic was notable for normal liver edge.  The uterus was  bulky.  Additional port sites were then placed, two on the right 9-10 cm apart, one on the distal left and intervening between the camera port site and the distal left was 8 mm AirSeal that was placed. With those placements done under direct  visualization, the robot was docked.  In arm #1, the vessel sealer was placed. In arm 4, the monopolar scissors and in arm 3, the long bipolar grasper.  I then went to the surgical console. At the surgical console, the pelvis was inspected.  Both ureters  were noted to be deep in the pelvis and peristalsing.  Both tubes had evidence of prior surgical separation.  The underlying mesosalpinx on both fallopian tubes were serially clamped, cauterized and cut using the vessel sealer.  The retroperitoneal  space was opened bilaterally and the medial portion of the peritoneum was opened.  Both right and left uteroovarian ligaments were serially clamped, cauterized and cut.  The anterior broad leaf was opened transversely and carried across the bladder and  the development of the vesicouterine peritoneum sharply and the bladder was displaced inferiorly with sharp and blunt dissection. With both uterine vessels skeletonized, both uterine vessels were then clamped, serially clamped, cauterized and then cut.   The vaginal insufflator was then placed and the bladder was further displaced inferiorly and an incision was made at the upper portion of the KOH RUMI cup and carried around circumferentially with the uterus being severed from its vaginal attachment.  With  that being done, the uterus was then  removed and the bladder was further displaced further off the vaginal cuff.  Hemostasis of the vaginal cuff was then achieved and the vessel sealer and the monopolar scissors were then replaced with a long tipped forceps and the mega suture needle driver respectively. 0  V-Loc suture x 2 was used to close the vaginal cuff. Digital palpation of the vaginal cuff was then performed with good approximation noted. With that being done, the  pressure was deflated to 8. Evidence of any small bleeders were taken care of with cauterization.  The abdomen was irrigated, suctioned of debris.  Both pedicles showed good hemostasis.  Both ureters were noted to be peristalsing.  At that point, the  Arista potato starch was then placed over the vaginal cuff.  The procedure was felt to be complete.  The instruments then were removed.  I went back to the patient's bedside sterilely.  The robotic ports were then removed and the AirSeal was also  deflated and removed.  The vaginal cuff had been inspected again at the end of the case, by visual as well as digital palpation by me and  it appeared to be well approximated.  The abdominal incisions were then closed with 4-0 Vicryl subcuticular  closure after hemostasis being achieved.   SPECIMENS: Uterus with both fallopian tubes sent to pathology, weighing 253.5 grams.  ESTIMATED BLOOD LOSS:  25 mL.  INTRAOPERATIVE FLUIDS: 1 liter.   URINE OUTPUT: Was about 150 mL.  COUNTS: Sponge and instrument counts were correct.  COMPLICATIONS:  none.  CONDITION: The patient tolerated the procedure well and was transferred to the recovery room in stable condition.   MUK D: 09/13/2021 10:57:49 am T: 09/13/2021 11:15:00 pm  JOB: [id number]/ [id number]

## 2021-09-13 NOTE — Interval H&P Note (Signed)
History and Physical Interval Note:  09/13/2021 7:33 AM  [name]  has presented today for surgery, with the diagnosis of symptomatic uterine fibroid, iron defiency anemia.  The various methods of treatment have been discussed with the patient and family. After consideration of risks, benefits and other options for treatment, the patient has consented to  [place], BILATERAL SALPINGECTOMY as a surgical intervention.  The patient's history has been reviewed, patient examined, no change in status, stable for surgery.  I have reviewed the patient's chart and labs.  Questions were answered to the patient's satisfaction.     [name] A Kelsa Jaworowski

## 2021-09-14 DIAGNOSIS — D251 Intramural leiomyoma of uterus: Secondary | ICD-10-CM | POA: Diagnosis not present

## 2021-09-14 LAB — CBC
HCT: 35.8 % — ABNORMAL LOW (ref 36.0–46.0)
Hemoglobin: 11.7 g/dL — ABNORMAL LOW (ref 12.0–15.0)
MCH: 27.7 pg (ref 26.0–34.0)
MCHC: 32.7 g/dL (ref 30.0–36.0)
MCV: 84.6 fL (ref 80.0–100.0)
Platelets: 268 10*3/uL (ref 150–400)
RBC: 4.23 MIL/uL (ref 3.87–5.11)
RDW: 13.3 % (ref 11.5–15.5)
WBC: 9 10*3/uL (ref 4.0–10.5)
nRBC: 0 % (ref 0.0–0.2)

## 2021-09-14 LAB — BASIC METABOLIC PANEL
Anion gap: 7 (ref 5–15)
BUN: 7 mg/dL (ref 6–20)
CO2: 28 mmol/L (ref 22–32)
Calcium: 8.5 mg/dL — ABNORMAL LOW (ref 8.9–10.3)
Chloride: 101 mmol/L (ref 98–111)
Creatinine, Ser: 0.78 mg/dL (ref 0.44–1.00)
GFR, Estimated: >60 mL/min (ref >60)
Glucose, Bld: 135 mg/dL — ABNORMAL HIGH (ref 70–99)
Potassium: 3.6 mmol/L (ref 3.5–5.1)
Sodium: 136 mmol/L (ref 135–145)

## 2021-09-14 MED ORDER — ACETAMINOPHEN 500 MG PO TABS
ORAL_TABLET | ORAL | Status: AC
  Filled 2021-09-14: qty 2

## 2021-09-14 MED ORDER — KETOROLAC TROMETHAMINE 30 MG/ML IJ SOLN
INTRAMUSCULAR | Status: AC
  Filled 2021-09-14: qty 1

## 2021-09-14 MED ORDER — OXYCODONE HCL 5 MG PO TABS
ORAL_TABLET | ORAL | Status: AC
  Filled 2021-09-14: qty 2

## 2021-09-14 MED ORDER — OXYCODONE HCL 5 MG PO TABS: 5.0000 mg | ORAL_TABLET | ORAL | 0 refills | Status: AC | PRN

## 2021-09-14 NOTE — Discharge Summary (Signed)
Physician Discharge Summary  Patient ID: [name] MRN: [id number] DOB/AGE: 41-Sep-1982 41 y.o.  Admit date: 09/13/2021 Discharge date: 09/14/2021  Admission Diagnoses: symptomatic uterine fibroids(menorrhagia with regular cycles, uterine fibroids), IDA  Discharge Diagnoses: same, migraine Principal Problem:   Uterine fibroid Active Problems:   S/P hysterectomy   Discharged Condition: stable  Hospital Course: pt was admitted to [facility].  She had a migraine on admission. Pt also had her cycle and was wearing 2 large pads both soaked. She underwent DaVinci robotic total hysterectomy, bilateral salpingectomy. Uncomplicated postoperative course.  Pt had pain control, voided w/o difficulty and tolerated regular diet. See operative report  Consults: None  Significant Diagnostic Studies: labs:  CBC Latest Ref Rng & Units 09/14/2021 09/09/2021  WBC 4.0 - 10.5 K/uL 9.0 6.0  Hemoglobin 12.0 - 15.0 g/dL 11.7(L) 13.0  Hematocrit 36.0 - 46.0 % 35.8(L) 40.9  Platelets 150 - 400 K/uL 268 264    BMP Latest Ref Rng & Units 09/14/2021 09/09/2021  Glucose 70 - 99 mg/dL 135(H) 130(H)  BUN 6 - 20 mg/dL 7 12  Creatinine 0.44 - 1.00 mg/dL 0.78 0.63  Sodium 135 - 145 mmol/L 136 138  Potassium 3.5 - 5.1 mmol/L 3.6 3.3(L)  Chloride 98 - 111 mmol/L 101 106  CO2 22 - 32 mmol/L 28 25  Calcium 8.9 - 10.3 mg/dL 8.5(L) 8.6(L)     Treatments: surgery: DaVinci  robotic total hysterectomy, bilateral salpingectomy  Discharge Exam: Blood pressure (!) 133/93, pulse 80, temperature 98 F (36.7 C), temperature source Oral, resp. rate 16, height 5\' 4"  (1.626 m), weight 113.5 kg, last menstrual period 09/13/2021, SpO2 100 %. General appearance: alert, cooperative, and no distress Resp: clear to auscultation bilaterally Cardio: regular rate and rhythm, S1, S2 normal, no murmur, click, rub or gallop GI: soft, non-tender; bowel sounds normal; no masses,  no organomegaly Pelvic: deferred Extremities: extremities  normal, atraumatic, no cyanosis or edema and no edema, redness or tenderness in the calves or thighs Skin: Skin color, texture, turgor normal. No rashes or lesions Incisions: well approximated Disposition: Discharge disposition: 01-Home or Self Care       Discharge Instructions     Call MD for:  persistant nausea and vomiting   Complete by: As directed    Call MD for:  severe uncontrolled pain   Complete by: As directed    Call MD for:  temperature >100.4   Complete by: As directed    Diet - low sodium heart healthy   Complete by: As directed    Diet general   Complete by: As directed    May walk up steps   Complete by: As directed    No wound care   Complete by: As directed       Allergies as of 09/14/2021   No Known Allergies      Medication List     TAKE these medications    acetaminophen 500 MG tablet Commonly known as: TYLENOL Take 1,000 mg by mouth every 6 (six) hours as needed.   ibuprofen 800 MG tablet Commonly known as: ADVIL Take 800 mg by mouth every 8 (eight) hours as needed.   NON FORMULARY Take 2 tablets by mouth daily as needed. Emergent-c   oxyCODONE 5 MG immediate release tablet Commonly known as: Oxy IR/ROXICODONE Take 1-2 tablets (5-10 mg total) by mouth every 4 (four) hours as needed for up to 7 days for moderate pain.   OZEMPIC (0.25 OR 0.5 MG/DOSE) [place] Inject into the skin  once a week. 0.25 weekly tuesday   rizatriptan 5 MG disintegrating tablet Commonly known as: MAXALT-MLT Take 5 mg by mouth as needed for migraine. May repeat in 2 hours if needed        Follow-up Information     Servando Salina, MD Follow up on 09/26/2021.   Specialty: Obstetrics and Gynecology Contact information: [address] [place] [address] [phone]                 Signed: Marvene Staff 09/14/2021, 8:23 AM

## 2021-09-14 NOTE — Progress Notes (Signed)
Subjective: Patient reports tolerating PO and no problems voiding.  Reviewed intraoperative findings with pt  Objective: I have reviewed patient's vital signs.  vital signs, intake and output, medications, and labs. Vitals:   09/14/21 0200 09/14/21 0620  BP: (!) 108/59 (!) 141/85  Pulse: 87 82  Resp: 12 16  Temp: 98.3 F (36.8 C) 98.1 F (36.7 C)  SpO2: 98% 100%   I/O last 3 completed shifts: In: 2511 [P.O.:480; I.V.:1931; IV Piggyback:100] Out: 2175 [Urine:2150; Blood:25] No intake/output data recorded.  Lab Results  Component Value Date   WBC 9.0 09/14/2021   HGB 11.7 (L) 09/14/2021   HCT 35.8 (L) 09/14/2021   MCV 84.6 09/14/2021   PLT 268 09/14/2021   Lab Results  Component Value Date   CREATININE 0.78 09/14/2021    EXAM General: alert, cooperative, and no distress Resp: clear to auscultation bilaterally Cardio: regular rate and rhythm, S1, S2 normal, no murmur, click, rub or gallop GI: soft, non-tender; bowel sounds normal; no masses,  no organomegaly and incision: clean, dry, and intact Extremities: extremities normal, atraumatic, no cyanosis or edema and no edema, redness or tenderness in the calves or thighs Vaginal Bleeding: none  Assessment: s/p Procedure(s): XI ROBOTIC ASSISTED TOTAL HYSTERECTOMY , BILATERAL SALPINGECTOMY: stable and progressing well  Plan: Encourage ambulation Discontinue IV fluids Discharge home D/C instructions reviewed  F/u 2 wk  LOS: 0 days    Marvene Staff, MD 09/14/2021 8:18 AM    09/14/2021, 8:18 AM

## 2021-09-14 NOTE — Discharge Instructions (Signed)
Call if temperature greater than equal to 100.4, nothing per vagina for 4-6 weeks or severe nausea vomiting, increased incisional pain , drainage or redness in the incision site, no straining with bowel movements, showers no bath °

## 2021-09-16 ENCOUNTER — Encounter (HOSPITAL_BASED_OUTPATIENT_CLINIC_OR_DEPARTMENT_OTHER): Payer: Self-pay | Admitting: Obstetrics and Gynecology

## 2021-09-16 LAB — SURGICAL PATHOLOGY

## 2022-03-14 ENCOUNTER — Emergency Department (HOSPITAL_BASED_OUTPATIENT_CLINIC_OR_DEPARTMENT_OTHER)
Admission: EM | Admit: 2022-03-14 | Discharge: 2022-03-14 | Disposition: A | Discharge status: Home / Self Care | Payer: BC Managed Care – PPO | Attending: Emergency Medicine | Admitting: Emergency Medicine

## 2022-03-14 ENCOUNTER — Other Ambulatory Visit: Payer: Self-pay

## 2022-03-14 ENCOUNTER — Encounter (HOSPITAL_BASED_OUTPATIENT_CLINIC_OR_DEPARTMENT_OTHER): Payer: Self-pay | Admitting: Emergency Medicine

## 2022-03-14 DIAGNOSIS — H9202 Otalgia, left ear: Secondary | ICD-10-CM | POA: Diagnosis present

## 2022-03-14 DIAGNOSIS — H60502 Unspecified acute noninfective otitis externa, left ear: Secondary | ICD-10-CM | POA: Diagnosis not present

## 2022-03-14 DIAGNOSIS — R7309 Other abnormal glucose: Secondary | ICD-10-CM | POA: Insufficient documentation

## 2022-03-14 LAB — CBG MONITORING, ED: Glucose-Capillary: 119 mg/dL — ABNORMAL HIGH (ref 70–99)

## 2022-03-14 MED ORDER — FLUCONAZOLE 150 MG PO TABS: 150.0000 mg | ORAL_TABLET | Freq: Once | ORAL | 0 refills | Status: AC

## 2022-03-14 MED ORDER — AMOXICILLIN-POT CLAVULANATE 875-125 MG PO TABS: 1.0000 | ORAL_TABLET | Freq: Two times a day (BID) | ORAL | 0 refills | Status: AC

## 2022-03-14 MED ORDER — AMOXICILLIN-POT CLAVULANATE 875-125 MG PO TABS
1.0000 | ORAL_TABLET | Freq: Once | ORAL | Status: AC
Start: 2022-03-14 — End: 2022-03-14
  Administered 2022-03-14: 1 via ORAL
  Filled 2022-03-14: qty 1

## 2022-03-14 MED ORDER — CIPROFLOXACIN-DEXAMETHASONE 0.3-0.1 % OT SUSP
4.0000 [drp] | Freq: Once | OTIC | Status: AC
  Administered 2022-03-14: 4 [drp] via OTIC
  Filled 2022-03-14: qty 7.5

## 2022-03-14 NOTE — Discharge Instructions (Addendum)
Continue using the Cipro drops in the left ear twice a day for 10 days.

## 2022-03-14 NOTE — ED Provider Notes (Signed)
[facility] EMERGENCY DEPARTMENT  Provider Note  CSN: [id number] Arrival date & time: 03/14/22 0215  History Chief Complaint  Patient presents with   Ear Drainage    [name] is a 41 y.o. female here with L ear pain and drainage. Pain/discomfort started a couple days ago, woke up during the night with bloody discharge from the L ear and increased pain. Had fever at home earlier in the week. She reports a similar presentation in early May (not March) after a trip to [place]. She saw PCP who washed her ear out, told her she had an infection and a ruptured TM and put her on Cipro drops. She reports improvement a few days later and she was asymptomatic until the last few days. Did not have ENT follow up.   Home Medications Prior to Admission medications   Medication Sig Start Date End Date Taking? Authorizing Provider  amoxicillin-clavulanate (AUGMENTIN) 875-125 MG tablet Take 1 tablet by mouth every 12 (twelve) hours. 03/14/22  Yes Truddie Hidden, MD  acetaminophen (TYLENOL) 500 MG tablet Take 1,000 mg by mouth every 6 (six) hours as needed.    [provider]  ibuprofen (ADVIL) 800 MG tablet Take 800 mg by mouth every 8 (eight) hours as needed.    [provider]  NON FORMULARY Take 2 tablets by mouth daily as needed. Emergent-c    [provider]  rizatriptan (MAXALT-MLT) 5 MG disintegrating tablet Take 5 mg by mouth as needed for migraine. May repeat in 2 hours if needed    [provider]  Semaglutide (OZEMPIC, 0.25 OR 0.5 MG/DOSE, [place]) Inject into the skin once a week. 0.25 weekly tuesday    [provider]     Allergies    Patient has no known allergies.   Review of Systems   Review of Systems Please see HPI for pertinent positives and negatives  Physical Exam BP (!) 145/101 (BP Location: Right Arm)   Pulse 87   Temp 98.4 F (36.9 C) (Oral)   Resp 18   Ht '5\' 4"'$  (1.626 m)   Wt 108 kg   SpO2 100%   BMI 40.85  kg/m   Physical Exam Vitals and nursing note reviewed.  Constitutional:      Appearance: Normal appearance.  HENT:     Head: Normocephalic and atraumatic.     Right Ear: Tympanic membrane normal.     Ears:     Comments: L TM obscured by OE and purulent material in canal    Nose: Nose normal.     Mouth/Throat:     Mouth: Mucous membranes are moist.  Eyes:     Extraocular Movements: Extraocular movements intact.     Conjunctiva/sclera: Conjunctivae normal.  Cardiovascular:     Rate and Rhythm: Normal rate.  Pulmonary:     Effort: Pulmonary effort is normal.     Breath sounds: Normal breath sounds.  Abdominal:     General: Abdomen is flat.     Palpations: Abdomen is soft.     Tenderness: There is no abdominal tenderness.  Musculoskeletal:        General: No swelling. Normal range of motion.     Cervical back: Neck supple.  Skin:    General: Skin is warm and dry.  Neurological:     General: No focal deficit present.     Mental Status: She is alert.  Psychiatric:        Mood and Affect: Mood normal.  ED Results / Procedures / Treatments   EKG None  Procedures Procedures  Medications Ordered in the ED Medications  ciprofloxacin-dexamethasone (CIPRODEX) 0.3-0.1 % OTIC (EAR) suspension 4 drop (4 drops Left EAR Given 03/14/22 0305)  amoxicillin-clavulanate (AUGMENTIN) 875-125 MG per tablet 1 tablet (1 tablet Oral Given 03/14/22 0305)    Initial Impression and Plan  Patient here with recurrent OE and likely OM. Will check CBG to ensure not a new diagnosis of diabetes. Begin Ciprodex drops and oral Augmentin. Plan ENT follow up.   ED Course       MDM Rules/Calculators/A&P Medical Decision Making Problems Addressed: Acute otitis externa of left ear, unspecified type: acute illness or injury  Amount and/or Complexity of Data Reviewed Labs: ordered. Decision-making details documented in ED Course.    Details: CBG is normal.  Risk Prescription drug  management.    Final Clinical Impression(s) / ED Diagnoses Final diagnoses:  Acute otitis externa of left ear, unspecified type    Rx / DC Orders ED Discharge Orders          Ordered    amoxicillin-clavulanate (AUGMENTIN) 875-125 MG tablet  Every 12 hours        03/14/22 0313             Truddie Hidden, MD 03/14/22 [phone]

## 2022-03-14 NOTE — ED Notes (Signed)
Checked CBG 119, RN Shaun informed

## 2022-03-14 NOTE — ED Triage Notes (Signed)
Woke up with sensation of water in ear noticed was bleeding. Has congestion in ear feels ok if she applies pressure to it. Happened in march was seen by PCP, was told ear infection with possible rupture. They flushed and gave ear drops.

## 2022-09-23 ENCOUNTER — Emergency Department (HOSPITAL_BASED_OUTPATIENT_CLINIC_OR_DEPARTMENT_OTHER): Payer: BC Managed Care – PPO

## 2022-09-23 ENCOUNTER — Encounter (HOSPITAL_BASED_OUTPATIENT_CLINIC_OR_DEPARTMENT_OTHER): Payer: Self-pay | Admitting: Urology

## 2022-09-23 ENCOUNTER — Emergency Department (HOSPITAL_BASED_OUTPATIENT_CLINIC_OR_DEPARTMENT_OTHER)
Admission: EM | Admit: 2022-09-23 | Discharge: 2022-09-23 | Disposition: A | Discharge status: Home / Self Care | Payer: BC Managed Care – PPO | Attending: Emergency Medicine | Admitting: Emergency Medicine

## 2022-09-23 ENCOUNTER — Other Ambulatory Visit: Payer: Self-pay

## 2022-09-23 DIAGNOSIS — R42 Dizziness and giddiness: Secondary | ICD-10-CM | POA: Diagnosis present

## 2022-09-23 DIAGNOSIS — R030 Elevated blood-pressure reading, without diagnosis of hypertension: Secondary | ICD-10-CM

## 2022-09-23 DIAGNOSIS — I10 Essential (primary) hypertension: Secondary | ICD-10-CM | POA: Diagnosis not present

## 2022-09-23 LAB — URINALYSIS, ROUTINE W REFLEX MICROSCOPIC
Bilirubin Urine: NEGATIVE
Glucose, UA: NEGATIVE mg/dL
Hgb urine dipstick: NEGATIVE
Ketones, ur: NEGATIVE mg/dL
Leukocytes,Ua: NEGATIVE
Nitrite: NEGATIVE
Protein, ur: NEGATIVE mg/dL
Specific Gravity, Urine: 1.02 (ref 1.005–1.030)
pH: 7 (ref 5.0–8.0)

## 2022-09-23 LAB — BASIC METABOLIC PANEL
Anion gap: 6 (ref 5–15)
BUN: 12 mg/dL (ref 6–20)
CO2: 29 mmol/L (ref 22–32)
Calcium: 9.4 mg/dL (ref 8.9–10.3)
Chloride: 105 mmol/L (ref 98–111)
Creatinine, Ser: 0.8 mg/dL (ref 0.44–1.00)
GFR, Estimated: >60 mL/min (ref >60)
Glucose, Bld: 105 mg/dL — ABNORMAL HIGH (ref 70–99)
Potassium: 3.3 mmol/L — ABNORMAL LOW (ref 3.5–5.1)
Sodium: 140 mmol/L (ref 135–145)

## 2022-09-23 LAB — CBC
HCT: 42.3 % (ref 36.0–46.0)
Hemoglobin: 13.6 g/dL (ref 12.0–15.0)
MCH: 26.7 pg (ref 26.0–34.0)
MCHC: 32.2 g/dL (ref 30.0–36.0)
MCV: 82.9 fL (ref 80.0–100.0)
Platelets: 260 10*3/uL (ref 150–400)
RBC: 5.1 MIL/uL (ref 3.87–5.11)
RDW: 13.2 % (ref 11.5–15.5)
WBC: 5.8 10*3/uL (ref 4.0–10.5)
nRBC: 0 % (ref 0.0–0.2)

## 2022-09-23 LAB — D-DIMER, QUANTITATIVE: D-Dimer, Quant: 0.39 ug/mL-FEU (ref 0.00–0.50)

## 2022-09-23 LAB — PREGNANCY, URINE: Preg Test, Ur: NEGATIVE

## 2022-09-23 LAB — TROPONIN I (HIGH SENSITIVITY)
Troponin I (High Sensitivity): 2 ng/L (ref <18)
Troponin I (High Sensitivity): 3 ng/L (ref <18)

## 2022-09-23 MED ORDER — SODIUM CHLORIDE 0.9 % IV BOLUS
1000.0000 mL | Freq: Once | INTRAVENOUS | Status: AC
  Administered 2022-09-23: 1000 mL via INTRAVENOUS

## 2022-09-23 MED ORDER — ACETAMINOPHEN 325 MG PO TABS
650.0000 mg | ORAL_TABLET | Freq: Once | ORAL | Status: AC
  Administered 2022-09-23: 650 mg via ORAL
  Filled 2022-09-23: qty 2

## 2022-09-23 MED ORDER — POTASSIUM CHLORIDE CRYS ER 20 MEQ PO TBCR
40.0000 meq | EXTENDED_RELEASE_TABLET | Freq: Once | ORAL | Status: AC
  Administered 2022-09-23: 40 meq via ORAL
  Filled 2022-09-23: qty 2

## 2022-09-23 MED ORDER — IOHEXOL 350 MG/ML SOLN
100.0000 mL | Freq: Once | INTRAVENOUS | Status: AC | PRN
  Administered 2022-09-23: 100 mL via INTRAVENOUS

## 2022-09-23 MED ORDER — ONDANSETRON HCL 4 MG/2ML IJ SOLN
4.0000 mg | Freq: Once | INTRAMUSCULAR | Status: AC
  Administered 2022-09-23: 4 mg via INTRAVENOUS
  Filled 2022-09-23: qty 2

## 2022-09-23 NOTE — ED Notes (Signed)
Patient transported to X-ray 

## 2022-09-23 NOTE — ED Provider Notes (Signed)
[name] Provider Note   CSN: [id number] Arrival date & time: 09/23/22  1313     History  Chief Complaint  Patient presents with   Dizziness    [name] is a 41 y.o. female.  Patient with a history of migraine headaches and recently diagnosed hypertension here presenting with a 2-week history of increasing elevated blood pressure, shortness of breath, lightheadedness and blurry vision.  Symptoms worsening over the past 2 days.  She saw her PCP and was started on hydrochlorothiazide 5 days ago for elevated blood pressure.  She still feels like she is having dizziness described as both room spinning and lightheadedness, shortness of breath, lightheadedness and blurry vision.  No focal weakness, numbness or tingling.  No chest pain.  Does have a diffuse headache similar to previous migraines that is gradual in onset.  No photophobia or phonophobia.  No nausea or vomiting.  She felt a pull in her right groin today while she was standing at her desk and pain has been constant since.  No pain with urination or blood in the urine.  No vaginal bleeding or discharge.  No back pain.  No focal weakness, numbness or tingling.  The history is provided by the patient.  Dizziness Associated symptoms: headaches and weakness   Associated symptoms: no chest pain and no shortness of breath        Home Medications Prior to Admission medications   Medication Sig Start Date End Date Taking? Authorizing Provider  acetaminophen (TYLENOL) 500 MG tablet Take 1,000 mg by mouth every 6 (six) hours as needed.    [provider]  amoxicillin-clavulanate (AUGMENTIN) 875-125 MG tablet Take 1 tablet by mouth every 12 (twelve) hours. 03/14/22   Truddie Hidden, MD  ibuprofen (ADVIL) 800 MG tablet Take 800 mg by mouth every 8 (eight) hours as needed.    [provider]  NON FORMULARY Take 2 tablets by mouth daily as needed. Emergent-c    [provider]  rizatriptan (MAXALT-MLT) 5 MG disintegrating tablet Take 5 mg by mouth as needed for migraine. May repeat in 2 hours if needed    [provider]  Semaglutide (OZEMPIC, 0.25 OR 0.5 MG/DOSE, [place]) Inject into the skin once a week. 0.25 weekly tuesday    [provider]      Allergies    Patient has no known allergies.    Review of Systems   Review of Systems  Constitutional:  Negative for activity change, appetite change and fever.  HENT:  Negative for congestion.   Eyes:  Positive for visual disturbance.  Respiratory:  Negative for cough, chest tightness and shortness of breath.   Cardiovascular:  Negative for chest pain.  Gastrointestinal:  Positive for abdominal pain.  Genitourinary:  Negative for dysuria and hematuria.  Musculoskeletal:  Negative for arthralgias and myalgias.  Neurological:  Positive for dizziness, weakness, light-headedness and headaches.   all other systems are negative except as noted in the HPI and PMH.    Physical Exam Updated Vital Signs BP (!) 166/95   Pulse 75   Temp 97.8 F (36.6 C)   Resp 17   Ht '5\' 4"'$  (1.626 m)   Wt 108 kg   SpO2 100%   BMI 40.87 kg/m  Physical Exam Vitals and nursing note reviewed.  Constitutional:      General: She is not in acute distress.    Appearance: She is well-developed. She is not ill-appearing.  HENT:  Head: Normocephalic and atraumatic.     Mouth/Throat:     Pharynx: No oropharyngeal exudate.  Eyes:     Conjunctiva/sclera: Conjunctivae normal.     Pupils: Pupils are equal, round, and reactive to light.     Comments: Horizontal fatigable nystagmus bilaterally.  Neck:     Comments: No meningismus. Cardiovascular:     Rate and Rhythm: Normal rate and regular rhythm.     Heart sounds: Normal heart sounds. No murmur heard. Pulmonary:     Effort: Pulmonary effort is normal. No respiratory distress.     Breath sounds: Normal breath sounds.  Abdominal:     Palpations: Abdomen  is soft.     Tenderness: There is abdominal tenderness. There is no guarding or rebound.     Comments: Chaperone present Journalist, newspaper.  Right inguinal tenderness without obvious bulge or mass.  Abdomen soft.  Musculoskeletal:        General: No tenderness. Normal range of motion.     Cervical back: Normal range of motion and neck supple.  Skin:    General: Skin is warm.  Neurological:     Mental Status: She is alert and oriented to person, place, and time.     Cranial Nerves: No cranial nerve deficit.     Motor: No abnormal muscle tone.     Coordination: Coordination normal.     Comments: CN 2-12 intact, no ataxia on finger to nose, no nystagmus, 5/5 strength throughout, no pronator drift, Romberg negative, normal gait.   Psychiatric:        Behavior: Behavior normal.     ED Results / Procedures / Treatments   Labs (all labs ordered are listed, but only abnormal results are displayed) Labs Reviewed  BASIC METABOLIC PANEL - Abnormal; Notable for the following components:      Result Value   Potassium 3.3 (*)    Glucose, Bld 105 (*)    All other components within normal limits  CBC  URINALYSIS, ROUTINE W REFLEX MICROSCOPIC  PREGNANCY, URINE  D-DIMER, QUANTITATIVE  TROPONIN I (HIGH SENSITIVITY)  TROPONIN I (HIGH SENSITIVITY)    EKG EKG Interpretation  Date/Time:  Tuesday September 23 2022 13:32:11 EST Ventricular Rate:  81 PR Interval:  122 QRS Duration: 74 QT Interval:  300 QTC Calculation: 348 R Axis:   53 Text Interpretation: Normal sinus rhythm Cannot rule out Anterior infarct , age undetermined Abnormal ECG When compared with ECG of 22-Jun-1999 08:23, PREVIOUS ECG IS PRESENT Nonspecific T wave abnormality Confirmed by Ezequiel Essex [phone]) on 09/23/2022 2:57:52 PM  Radiology CT ANGIO HEAD NECK W WO CM  Result Date: 09/23/2022 CLINICAL DATA:  Stroke suspected. EXAM: CT ANGIOGRAPHY HEAD AND NECK TECHNIQUE: Multidetector CT imaging of the head and neck was performed  using the standard protocol during bolus administration of intravenous contrast. Multiplanar CT image reconstructions and MIPs were obtained to evaluate the vascular anatomy. Carotid stenosis measurements (when applicable) are obtained utilizing NASCET criteria, using the distal internal carotid diameter as the denominator. RADIATION DOSE REDUCTION: This exam was performed according to the departmental dose-optimization program which includes automated exposure control, adjustment of the mA and/or kV according to patient size and/or use of iterative reconstruction technique. CONTRAST:  137m OMNIPAQUE IOHEXOL 350 MG/ML SOLN COMPARISON:  None Available. FINDINGS: CT HEAD FINDINGS Brain: No evidence of acute infarction, hemorrhage, hydrocephalus, extra-axial collection or mass lesion/mass effect. Enlarged and partially empty sella. Vascular: No hyperdense vessel or unexpected calcification. There is narrowing of the sigmoid/transverse sinus junction  bilaterally. 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 Skull: Normal. Negative for fracture or focal lesion. Sinuses/Orbits: No acute finding. Other: None. Review of the MIP images confirms the above findings CTA NECK FINDINGS Limitations: Assessment for the presence of vascular occlusion is limited due to poor opacification of the arterial system and venous contaminiation. Aortic arch: Common origin of the brachiocephalic and left common carotid artery. Imaged portion shows no evidence of aneurysm or dissection. No significant stenosis of the major arch vessel origins. Right carotid system: No evidence of dissection, stenosis (50% or greater), or occlusion. Left carotid system: No evidence of dissection, stenosis (50% or greater), or occlusion. Vertebral arteries: Left dominant system. No evidence of dissection, stenosis (50% or greater), or occlusion. Skeleton: Straightening of the normal cervical lordosis. Other neck: Negative. Upper chest:  Negative. Review of the MIP images confirms the above findings CTA HEAD FINDINGS Anterior circulation: No significant stenosis, proximal occlusion, aneurysm, or vascular malformation. Posterior circulation: No significant stenosis, proximal occlusion, aneurysm, or vascular malformation. Venous sinuses: As permitted by contrast timing, patent. Anatomic variants: Fetal type PCA on the right with the P2 segment supplied by the right PCOM. Review of the MIP images confirms the above findings IMPRESSION: Assessment for the presence of vascular occlusion is limited due to poor opacification of the arterial system and venous contaminiation. 1. Within this limitation, no evidence of vascular occlusion or hemodynamically significant stenosis. 2. No acute intracranial abnormality. 3. Enlarged and partially empty sella, which is nonspecific but can be seen in the setting of idiopathic intracranial hypertension. Electronically Signed   By: Marin Roberts M.D.   On: 09/23/2022 16:48   DG Chest 2 View  Result Date: 09/23/2022 CLINICAL DATA:  Shortness of breath EXAM: CHEST - 2 VIEW COMPARISON:  08/14/2018 FINDINGS: The heart size and mediastinal contours are within normal limits. Both lungs are clear. The visualized skeletal structures are unremarkable. IMPRESSION: No active cardiopulmonary disease. Electronically Signed   By: Elmer Picker M.D.   On: 09/23/2022 16:44   CT Renal Stone Study  Result Date: 09/23/2022 CLINICAL DATA:  Abdominal and flank pain.  Hypertension. EXAM: CT ABDOMEN AND PELVIS WITHOUT CONTRAST TECHNIQUE: Multidetector CT imaging of the abdomen and pelvis was performed following the standard protocol without IV contrast. RADIATION DOSE REDUCTION: This exam was performed according to the departmental dose-optimization program which includes automated exposure control, adjustment of the mA and/or kV according to patient size and/or use of iterative reconstruction technique. COMPARISON:  Pelvic  ultrasound 02/14/2021 FINDINGS: Lower chest: Unremarkable Hepatobiliary: Cholecystectomy.  Otherwise unremarkable Pancreas: Unremarkable Spleen: Unremarkable Adrenals/Urinary Tract: Unremarkable Stomach/Bowel: There are a few scattered colonic diverticula. Normal appendix. No dilated small bowel. Vascular/Lymphatic: Unremarkable Reproductive: Hysterectomy.  Adnexa unremarkable. Other: No supplemental non-categorized findings. 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 IMPRESSION: 1. A specific cause for the patient's abdominal pain is not identified. 2. 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 3. There are a few scattered colonic diverticula. Electronically Signed   By: Van Clines M.D.   On: 09/23/2022 16:40    Procedures Procedures    Medications Ordered in ED Medications  acetaminophen (TYLENOL) tablet 650 mg (650 mg Oral Given 09/23/22 1522)  sodium chloride 0.9 % bolus 1,000 mL (1,000 mLs Intravenous New Bag/Given 09/23/22 1525)  ondansetron (ZOFRAN) injection 4  mg (4 mg Intravenous Given 09/23/22 1526)  iohexol (OMNIPAQUE) 350 MG/ML injection 100 mL (100 mLs Intravenous Contrast Given 09/23/22 1611)    ED Course/ Medical Decision Making/ A&P                           Medical Decision Making Amount and/or Complexity of Data Reviewed Labs: ordered. Decision-making details documented in ED Course. Radiology: ordered and independent interpretation performed. Decision-making details documented in ED Course. ECG/medicine tests: ordered and independent interpretation performed. Decision-making details documented in ED Course.  Risk OTC drugs. Prescription drug management.   Several weeks of elevated blood pressure with dizziness  lightheadedness, blurry vision and shortness of breath.  On arrival blood pressure elevated but not dangerously so.  EKG without acute ischemia.  Neurological exam is nonfocal.  Labs reassuring with negative troponin x 2.  Negative D-dimer.  Low suspicion for ACS or pulmonary embolism.  CTA shows no large vessel occlusion or significant stenosis of febrile or basilar arteries.  Patient feels improved.  Orthostatics are negative.  Blood pressure has spontaneously improved to 139/62.  She denies chest pain or shortness of breath.  Low suspicion for ACS or pulmonary embolism.  Denies dizziness with ambulation.  She describes more of a lightheadedness rather than vertigo.  Her orthostatics are negative.  She is not dizzy currently when she walks around.  She does have nystagmus on exam but no other focal neurological deficits suggest posterior circulation infarct.  MRI not available.  Discussed with patient she could be transferred for MRI when she prefers to go home.  Understands there is a small possibility of missing a CVA that is not appreciated on CT scan.  She appears to have capacity to make this decision.  Advised monitoring her blood pressure at home and following up with her PCP for medication adjustments.  Return to the ED with exertional chest pain, pain associate with shortness of breath, nausea, vomiting, focal neurological deficits or other concerns.        Final Clinical Impression(s) / ED Diagnoses Final diagnoses:  Dizziness  Elevated blood pressure reading    Rx / DC Orders ED Discharge Orders     None         Carlisha Wisler, Annie Main, MD 09/23/22 1724

## 2022-09-23 NOTE — Discharge Instructions (Addendum)
Your testing today is reassuring.  No evidence of heart attack or blood clot in the lung.  We discussed low suspicion for stroke today but cannot rule out completely without MRI.  You declined transfer for MRI today.  Follow-up with your primary doctor for further blood pressure medication adjustments and keep a record of your blood pressure at home.  Return to the ED with exertional chest pain, pain associate with shortness of breath, nausea, vomiting, sweating or other concerns.

## 2022-09-23 NOTE — ED Notes (Signed)
Pt provided water and able to ambulate to BR without difficulty. Reports improvement in dizziness

## 2022-09-23 NOTE — ED Triage Notes (Signed)
Pt states blood pressure has been high "for a while" 421/031 systolic Was started on BP meds (HCTZ)  5 days ago  States lightheaded, blurry vision and sob that started today  States felt "bulge in groin area"
